# Patient Record
Sex: Female | Born: 1960
Health system: Southern US, Community
[De-identification: ages and names within clinical notes are randomized; demographics above are authoritative.]

## PROBLEM LIST (undated history)

## (undated) DIAGNOSIS — I341 Nonrheumatic mitral (valve) prolapse: Secondary | ICD-10-CM

## (undated) DIAGNOSIS — J302 Other seasonal allergic rhinitis: Secondary | ICD-10-CM

## (undated) HISTORY — PX: OTHER SURGICAL HISTORY: SHX169

## (undated) HISTORY — PX: WISDOM TOOTH EXTRACTION: SHX21

---

## 2006-07-28 ENCOUNTER — Other Ambulatory Visit: Admission: RE | Admit: 2006-07-28 | Discharge: 2006-07-28 | Payer: Self-pay | Admitting: Family Medicine

## 2011-04-28 ENCOUNTER — Encounter (HOSPITAL_COMMUNITY): Payer: Self-pay

## 2011-05-12 ENCOUNTER — Encounter (HOSPITAL_COMMUNITY): Payer: Self-pay

## 2011-05-12 ENCOUNTER — Encounter (HOSPITAL_COMMUNITY)
Admission: RE | Admit: 2011-05-12 | Discharge: 2011-05-12 | Disposition: A | Discharge status: Home / Self Care | Payer: 59 | Source: Ambulatory Visit | Attending: Obstetrics and Gynecology | Admitting: Obstetrics and Gynecology

## 2011-05-12 HISTORY — DX: Nonrheumatic mitral (valve) prolapse: I34.1

## 2011-05-12 LAB — CBC
MCHC: 34.3 g/dL (ref 30.0–36.0)
RDW: 12.5 % (ref 11.5–15.5)

## 2011-05-12 NOTE — Patient Instructions (Addendum)
20 [name]  05/12/2011   Your procedure is scheduled on:  05/19/11 9:00  Enter through the Main Entrance of [facility] at 730 AM.  Pick up the phone at the desk and dial [phone].   Call this number if you have problems the morning of surgery: [phone]   Remember:   Do not eat food:After Midnight.  Do not drink clear liquids: After Midnight.  Take these medicines the morning of surgery with A SIP OF WATER: NA   Do not wear jewelry, make-up or nail polish.  Do not wear lotions, powders, or perfumes. You may wear deodorant.  Do not shave 48 hours prior to surgery.  Do not bring valuables to the hospital.  Contacts, dentures or bridgework may not be worn into surgery.  Leave suitcase in the car. After surgery it may be brought to your room.  For patients admitted to the hospital, checkout time is 11:00 AM the day of discharge.   Patients discharged the day of surgery will not be allowed to drive home.  Name and phone number of your driver: William-husband  Special Instructions: CHG Shower Use Special Wash: 1/2 bottle night before surgery and 1/2 bottle morning of surgery.   Please read over the following fact sheets that you were given: Surgical Site Infection Prevention

## 2011-05-18 NOTE — H&P (Signed)
NAMEESTELA, [name]              ACCOUNT NO.:  [id number]  MEDICAL RECORD NO.:  [id number]  LOCATION:  [facility]                         FACILITY:  [facility]  PHYSICIAN:  Lenoard Aden, M.D.DATE OF BIRTH:  1961-02-23  DATE OF ADMISSION:  04/08/2011 DATE OF DISCHARGE:                             HISTORY & PHYSICAL   CHIEF COMPLAINT:  Dysfunctional uterine bleeding.  HISTORY OF PRESENT ILLNESS:  The patient is a 51 year old white female, G3, P3, with a history of postmenopausal bleeding.  PAST MEDICAL HISTORY:  Remarkable.  MEDICATIONS:  hormone replacement therapy and calcium.  ALLERGIES:  None.  FAMILY HISTORY:  Skin cancer, diabetes, MI, and hypertension.  PERSONAL HISTORY:  Three previous vaginal deliveries.  She is a nonsmoker, nondrinker.  She denies domestic or physical violence.  She does have a history of a mitral valve disorder and heart disease requiring previous heart catheterization.  PHYSICAL EXAMINATION:  GENERAL:  She is a well-developed, well-nourished white female. VITAL SIGNS:  Height of 66 inches, weight of 145 pounds. HEENT:  Normal. NECK:  Supple.  Full range of motion. LUNGS:  Clear. HEART:  Regular rhythm. ABDOMEN:  Soft and nontender. PELVIC:  An anteflexed uterus and no adnexal masses. EXTREMITIES:  No cords. NEUROLOGIC:  Nonfocal. SKIN:  Intact.  IMPRESSION:  Postmenopausal bleeding with questionable structural lesion on sonohysterogram.  PLAN:  Diagnostic hysteroscopy, D and C, VersaPoint resection.  Risks of anesthesia, infection, bleeding, injury to abdominal organs, need for repair was discussed, delayed versus immediate complications to include bowel, bladder injury noted.  The patient acknowledges and wishes to proceed.     Lenoard Aden, M.D.     RJT/MEDQ  D:  05/18/2011  T:  05/18/2011  Job:  [id number]

## 2011-05-19 ENCOUNTER — Ambulatory Visit (HOSPITAL_COMMUNITY): Payer: 59 | Admitting: Anesthesiology

## 2011-05-19 ENCOUNTER — Other Ambulatory Visit: Payer: Self-pay | Admitting: Obstetrics and Gynecology

## 2011-05-19 ENCOUNTER — Encounter (HOSPITAL_COMMUNITY): Payer: Self-pay | Admitting: Anesthesiology

## 2011-05-19 ENCOUNTER — Ambulatory Visit (HOSPITAL_COMMUNITY)
Admission: RE | Admit: 2011-05-19 | Discharge: 2011-05-19 | Disposition: A | Discharge status: Home / Self Care | Payer: 59 | Source: Ambulatory Visit | Attending: Obstetrics and Gynecology | Admitting: Obstetrics and Gynecology

## 2011-05-19 ENCOUNTER — Encounter (HOSPITAL_COMMUNITY)
Admission: RE | Disposition: A | Discharge status: Home / Self Care | Payer: Self-pay | Source: Ambulatory Visit | Attending: Obstetrics and Gynecology

## 2011-05-19 ENCOUNTER — Encounter (HOSPITAL_COMMUNITY): Payer: Self-pay | Admitting: *Deleted

## 2011-05-19 DIAGNOSIS — N840 Polyp of corpus uteri: Secondary | ICD-10-CM | POA: Insufficient documentation

## 2011-05-19 DIAGNOSIS — N950 Postmenopausal bleeding: Secondary | ICD-10-CM | POA: Insufficient documentation

## 2011-05-19 DIAGNOSIS — N938 Other specified abnormal uterine and vaginal bleeding: Secondary | ICD-10-CM

## 2011-05-19 HISTORY — DX: Other seasonal allergic rhinitis: J30.2

## 2011-05-19 SURGERY — DILATATION & CURETTAGE/HYSTEROSCOPY WITH VERSAPOINT RESECTION
Anesthesia: General

## 2011-05-19 MED ORDER — FENTANYL CITRATE 0.05 MG/ML IJ SOLN: 25.0000 ug | INTRAMUSCULAR | Status: DC | PRN

## 2011-05-19 MED ORDER — FENTANYL CITRATE 0.05 MG/ML IJ SOLN
INTRAMUSCULAR | Status: DC | PRN
  Administered 2011-05-19: 100 ug via INTRAVENOUS

## 2011-05-19 MED ORDER — ONDANSETRON HCL 4 MG/2ML IJ SOLN
INTRAMUSCULAR | Status: DC | PRN
  Administered 2011-05-19: 4 mg via INTRAVENOUS

## 2011-05-19 MED ORDER — LIDOCAINE HCL (CARDIAC) 20 MG/ML IV SOLN
INTRAVENOUS | Status: DC | PRN
  Administered 2011-05-19: 80 mg via INTRAVENOUS

## 2011-05-19 MED ORDER — VASOPRESSIN 20 UNIT/ML IJ SOLN
INTRAVENOUS | Status: DC | PRN
  Administered 2011-05-19: 09:00:00 via INTRAMUSCULAR

## 2011-05-19 MED ORDER — KETOROLAC TROMETHAMINE 60 MG/2ML IM SOLN
INTRAMUSCULAR | Status: DC | PRN
  Administered 2011-05-19: 30 mg via INTRAMUSCULAR

## 2011-05-19 MED ORDER — LACTATED RINGERS IV SOLN
INTRAVENOUS | Status: DC
  Administered 2011-05-19: 125 mL/h via INTRAVENOUS
  Administered 2011-05-19: 10:00:00 via INTRAVENOUS

## 2011-05-19 MED ORDER — TRAMADOL HCL 50 MG PO TABS: 50.0000 mg | ORAL_TABLET | Freq: Four times a day (QID) | ORAL | Status: AC | PRN

## 2011-05-19 MED ORDER — DEXAMETHASONE SODIUM PHOSPHATE 4 MG/ML IJ SOLN
INTRAMUSCULAR | Status: DC | PRN
  Administered 2011-05-19: 10 mg via INTRAVENOUS

## 2011-05-19 MED ORDER — GLYCOPYRROLATE 0.2 MG/ML IJ SOLN
INTRAMUSCULAR | Status: DC | PRN
  Administered 2011-05-19: 0.1 mg via INTRAVENOUS

## 2011-05-19 MED ORDER — BUPIVACAINE HCL (PF) 0.25 % IJ SOLN
INTRAMUSCULAR | Status: DC | PRN
  Administered 2011-05-19: 20 mL

## 2011-05-19 MED ORDER — MIDAZOLAM HCL 5 MG/5ML IJ SOLN
INTRAMUSCULAR | Status: DC | PRN
  Administered 2011-05-19: 2 mg via INTRAVENOUS

## 2011-05-19 MED ORDER — PROPOFOL 10 MG/ML IV EMUL
INTRAVENOUS | Status: DC | PRN
  Administered 2011-05-19: 160 mg via INTRAVENOUS

## 2011-05-19 MED ORDER — KETOROLAC TROMETHAMINE 30 MG/ML IJ SOLN
INTRAMUSCULAR | Status: DC | PRN
  Administered 2011-05-19: 30 mg via INTRAVENOUS

## 2011-05-19 SURGICAL SUPPLY — 12 items
CANISTER SUCTION 2500CC (MISCELLANEOUS) ×2 IMPLANT
CATH ROBINSON RED A/P 16FR (CATHETERS) ×2 IMPLANT
CLOTH BEACON ORANGE TIMEOUT ST (SAFETY) ×2 IMPLANT
CONTAINER PREFILL 10% NBF 60ML (FORM) ×4 IMPLANT
ELECTRODE RT ANGLE VERSAPOINT (CUTTING LOOP) ×2 IMPLANT
GLOVE BIO SURGEON STRL SZ7.5 (GLOVE) ×4 IMPLANT
GOWN PREVENTION PLUS LG XLONG (DISPOSABLE) ×2 IMPLANT
GOWN STRL REIN XL XLG (GOWN DISPOSABLE) ×2 IMPLANT
PACK HYSTEROSCOPY LF (CUSTOM PROCEDURE TRAY) ×2 IMPLANT
SYR TB 1ML 25GX5/8 (SYRINGE) ×2 IMPLANT
TOWEL OR 17X24 6PK STRL BLUE (TOWEL DISPOSABLE) ×4 IMPLANT
WATER STERILE IRR 1000ML POUR (IV SOLUTION) ×2 IMPLANT

## 2011-05-19 NOTE — Op Note (Signed)
05/19/2011  9:39 AM  PATIENT:  [name]  51 y.o. female  PRE-OPERATIVE DIAGNOSIS:  Dysfunctional Uterine Bleeding  POST-OPERATIVE DIAGNOSIS:  Dysfunctional Uterine Bleeding Endometrial polyps  PROCEDURE:  Procedure(s): DILATATION & CURETTAGE/HYSTEROSCOPY WITH VERSAPOINT RESECTION of Endometrial polyps  SURGEON:  Surgeon(s): Lenoard Aden, MD  ASSISTANTS: none   ANESTHESIA:   local and general  ESTIMATED BLOOD LOSS: * No blood loss amount entered *   DRAINS: none   LOCAL MEDICATIONS USED:  MARCAINE 20CC  SPECIMEN:  Source of Specimen:  Endometrial polyp fragments and EMC  DISPOSITION OF SPECIMEN:  PATHOLOGY  COUNTS:  YES  DICTATION #: [id number]  PLAN OF CARE: DC home  PATIENT DISPOSITION:  PACU - hemodynamically stable.  Fluid deficit less than 50 ml.

## 2011-05-19 NOTE — Progress Notes (Signed)
Patient ID: [name], female   DOB: 12/24/1960, 51 y.o.   MRN: [id number] Consent done. Pt examined. No changes noted.

## 2011-05-19 NOTE — Transfer of Care (Signed)
Immediate Anesthesia Transfer of Care Note  Patient: [name]  Procedure(s) Performed:  DILATATION & CURETTAGE/HYSTEROSCOPY WITH VERSAPOINT RESECTION  Patient Location: PACU  Anesthesia Type: General  Level of Consciousness: awake, alert  and oriented  Airway & Oxygen Therapy: Patient Spontanous Breathing and Patient connected to nasal cannula oxygen  Post-op Assessment: Report given to PACU RN and Post -op Vital signs reviewed and stable  Post vital signs: Reviewed and stable  Complications: No apparent anesthesia complications

## 2011-05-19 NOTE — Anesthesia Procedure Notes (Signed)
Procedure Name: LMA Insertion Date/Time: 05/19/2011 9:20 AM Performed by: Karleen Dolphin Pre-anesthesia Checklist: Patient identified, Patient being monitored, Emergency Drugs available, Timeout performed and Suction available Patient Re-evaluated:Patient Re-evaluated prior to inductionOxygen Delivery Method: Circle System Utilized Preoxygenation: Pre-oxygenation with 100% oxygen Intubation Type: IV induction Ventilation: Mask ventilation without difficulty LMA: LMA inserted LMA Size: 4.0 Number of attempts: 1 Tube secured with: Tape

## 2011-05-19 NOTE — Anesthesia Preprocedure Evaluation (Signed)
Anesthesia Evaluation  Patient identified by MRN, date of birth, ID band Patient awake    Reviewed: Allergy & Precautions, H&P , NPO status , Patient's Chart, lab work & pertinent test results  Airway Mallampati: II TM Distance: >3 FB Neck ROM: full    Dental No notable dental hx. (+) Caps   Pulmonary neg pulmonary ROS,  clear to auscultation  Pulmonary exam normal       Cardiovascular neg cardio ROS regular Normal    Neuro/Psych Negative Neurological ROS  Negative Psych ROS   GI/Hepatic negative GI ROS, Neg liver ROS,   Endo/Other  Negative Endocrine ROS  Renal/GU negative Renal ROS  Genitourinary negative   Musculoskeletal   Abdominal   Peds  Hematology negative hematology ROS (+)   Anesthesia Other Findings   Reproductive/Obstetrics negative OB ROS                           Anesthesia Physical Anesthesia Plan  ASA: I  Anesthesia Plan: General LMA   Post-op Pain Management:    Induction:   Airway Management Planned:   Additional Equipment:   Intra-op Plan:   Post-operative Plan:   Informed Consent: I have reviewed the patients History and Physical, chart, labs and discussed the procedure including the risks, benefits and alternatives for the proposed anesthesia with the patient or authorized representative who has indicated his/her understanding and acceptance.     Plan Discussed with: Anesthesiologist, CRNA and Surgeon  Anesthesia Plan Comments:         Anesthesia Quick Evaluation

## 2011-05-19 NOTE — Anesthesia Postprocedure Evaluation (Signed)
  Anesthesia Post-op Note  Patient: [name]  Procedure(s) Performed:  DILATATION & CURETTAGE/HYSTEROSCOPY WITH VERSAPOINT RESECTION  Patient Location: PACU  Anesthesia Type: General  Level of Consciousness: awake, alert  and oriented  Airway and Oxygen Therapy: Patient Spontanous Breathing  Post-op Pain: none  Post-op Assessment: Post-op Vital signs reviewed, Patient's Cardiovascular Status Stable, Respiratory Function Stable, Patent Airway, No signs of Nausea or vomiting and Pain level controlled  Post-op Vital Signs: Reviewed and stable  Complications: No apparent anesthesia complications

## 2011-05-19 NOTE — Op Note (Signed)
NAME:  [name], [name]              ACCOUNT NO.:  [id number]  MEDICAL RECORD NO.:  [id number]  LOCATION:  [facility]                          FACILITY:  [facility]  PHYSICIAN:  Lenoard Aden, M.D.DATE OF BIRTH:  12-Aug-1960  DATE OF PROCEDURE:  05/19/2011 DATE OF DISCHARGE:                              OPERATIVE REPORT   DESCRIPTION OF PROCEDURE:  After being apprised of risks of anesthesia, infection, bleeding, injury to abdominal organs, possible need for repair, delayed versus immediate complications to include bowel and bladder injury, possible need for repair, the patient was brought to the operating room and was administered a general anesthetic without complications.  She was prepped and draped in the usual sterile fashion. Feet were placed in Yellofin stirrups in dorsal lithotomy position. Exam under anesthesia revealed a mid position to anteflexed uterus and no adnexal masses noted.  Speculum was placed.  Single-tooth tenaculum was placed on the anterior lip of the cervix.  The dilute Pitressin solution 20 and 50 was placed, 16 mL total at 3 and 9 o'clock at the cervical vaginal junction, then followed by a dilute Marcaine solution for a standard paracervical block, 20 mL total.  Cervix was then easily dilated up to a #27 Pratt dilator.  Hysteroscope was placed. Visualization revealed 2 posterior wall endometrial polyps which were resected in multiple passes using the VersaPoint resectoscope with the right-angled loop.  Good hemostasis was noted.  Endometrial cavity was otherwise clear.  Endometrial curettings were collected in a 4-quadrant method using sharp curettage.  Good hemostasis was noted.  Fluid deficit was less than 50 mL.  The procedure was terminated.  All instruments were removed.  Hemostasis was assured.  The patient tolerated the procedure well and was awakened and transferred to recovery in good condition.     Lenoard Aden, M.D.     RJT/MEDQ  D:   05/19/2011  T:  05/19/2011  Job:  [id number]

## 2013-08-24 ENCOUNTER — Other Ambulatory Visit: Payer: Self-pay | Admitting: Radiology

## 2017-03-04 DIAGNOSIS — Z23 Encounter for immunization: Secondary | ICD-10-CM | POA: Diagnosis not present

## 2017-06-15 ENCOUNTER — Telehealth: Payer: Self-pay | Admitting: Cardiology

## 2017-06-15 DIAGNOSIS — Z23 Encounter for immunization: Secondary | ICD-10-CM | POA: Diagnosis not present

## 2017-06-15 DIAGNOSIS — M79601 Pain in right arm: Secondary | ICD-10-CM | POA: Diagnosis not present

## 2017-06-15 DIAGNOSIS — M79602 Pain in left arm: Secondary | ICD-10-CM | POA: Diagnosis not present

## 2017-06-15 NOTE — Telephone Encounter (Signed)
I did not need this encounter. °

## 2017-06-16 ENCOUNTER — Ambulatory Visit: Payer: 59 | Admitting: Cardiovascular Disease

## 2017-06-16 ENCOUNTER — Encounter (INDEPENDENT_AMBULATORY_CARE_PROVIDER_SITE_OTHER): Payer: Self-pay

## 2017-06-16 ENCOUNTER — Encounter: Payer: Self-pay | Admitting: Cardiovascular Disease

## 2017-06-16 VITALS — BP 154/94 | HR 82 | Ht 66.0 in | Wt 134.6 lb

## 2017-06-16 DIAGNOSIS — E782 Mixed hyperlipidemia: Secondary | ICD-10-CM | POA: Diagnosis not present

## 2017-06-16 DIAGNOSIS — R0789 Other chest pain: Secondary | ICD-10-CM

## 2017-06-16 DIAGNOSIS — I10 Essential (primary) hypertension: Secondary | ICD-10-CM

## 2017-06-16 DIAGNOSIS — R9431 Abnormal electrocardiogram [ECG] [EKG]: Secondary | ICD-10-CM

## 2017-06-16 MED ORDER — ASPIRIN EC 81 MG PO TBEC: 81.0000 mg | DELAYED_RELEASE_TABLET | Freq: Every day | ORAL | Status: DC

## 2017-06-16 MED ORDER — METOPROLOL TARTRATE 50 MG PO TABS: 50.0000 mg | ORAL_TABLET | Freq: Once | ORAL | 0 refills | Status: DC

## 2017-06-16 NOTE — Patient Instructions (Signed)
Medication Instructions:  Your physician has recommended you make the following change in your medication:  START Aspirin 81 mg once daily   Labwork: TODAY - basic metabolic panel, liver panel, cholesterol   Testing/Procedures: [facility] of [facility] at xx:xx AM (30-45 minutes prior to test start time)  [facility] [address] [place], [place] [address] [phone]  Proceed to the [facility] Radiology Department (First Floor).  Please follow these instructions carefully (unless otherwise directed):  Hold all erectile dysfunction medications at least 48 hours prior to test.  On the Night Before the Test: . Drink plenty of water. . Do not consume any caffeinated/decaffeinated beverages or chocolate 12 hours prior to your test. . Do not take any antihistamines 12 hours prior to your test. . If you take Metformin do not take 24 hours prior to test. . If the patient has contrast allergy: ? Patient will need a prescription for Prednisone and very clear instructions (as follows): 1. Prednisone 50 mg - take 13 hours prior to test 2. Take another Prednisone 50 mg 7 hours prior to test 3. Take another Prednisone 50 mg 1 hour prior to test 4. Take Benadryl 50 mg 1 hour prior to test . Patient must complete all four doses of above prophylactic medications. . Patient will need a ride after test due to Benadryl.  On the Day of the Test: . Drink plenty of water. Do not drink any water within one hour of the test. . Do not eat any food 4 hours prior to the test. . You may take your regular medications prior to the test. . IF NOT ON A BETA BLOCKER - Take 50 mg of lopressor (metoprolol) one hour before the test. . HOLD Furosemide morning of the test.  After the Test: . Drink plenty of water. . After receiving IV contrast, you may experience a mild flushed feeling. This is normal. . On occasion, you may experience a mild  rash up to 24 hours after the test. This is not dangerous. If this occurs, you can take Benadryl 25 mg and increase your fluid intake. . If you experience trouble breathing, this can be serious. If it is severe call 911 IMMEDIATELY. If it is mild, please call our office. . If you take any of these medications: Glipizide/Metformin, Avandament, Glucavance, please do not take 48 hours after completing test.  Your physician has requested that you have an echocardiogram. Echocardiography is a painless test that uses sound waves to create images of your heart. It provides your doctor with information about the size and shape of your heart and how well your heart's chambers and valves are working. This procedure takes approximately one hour. There are no restrictions for this procedure.    Follow-Up: Your physician recommends that you schedule a follow-up appointment in: 6 weeks with Dr. Elease HashimotoNahser   If you need a refill on your cardiac medications before your next appointment, please call your pharmacy.   Thank you for choosing [facility]! Eligha BridegroomMichelle Swinyer, RN [phone]  '

## 2017-06-16 NOTE — Progress Notes (Signed)
Cardiology Office Note:    Date:  06/16/2017   ID:  [name], DOB January 18, 1961, MRN [id number]  PCP:  Natalie Hazel, MD  Cardiologist:  No primary care provider on file.   Referring MD: No ref. provider found   Problem List 1. Mitral Valve prolapse 2. Chest pain  3.  Hyperlipidemia   Chief Complaint  Patient presents with  . Chest Pain    History of Present Illness:    [name] is a 57 y.o. female with a hx of chest pain .  Seen with husband , Will Pt has various issues. 2 weeks ago Had bilateral jaw pain and intrascapular pain after af vigorous workout. Did not feel right all day  Since that time , has had off and on pain ,  Dull pain in left arm at rest Has had intermittant dull chest pain .   Last for 1 sec.   Has had sensation of being cold but was sweating  No aches or chilll,  No fever  BP has been elevated recently    Was seen by Dr. Allyson Flores 20 years ago  Echo and GXT looked ok ( was told her heart was a little enlarged )  From Sept. 2017 Chol = 251 Trigs = 100 HDL = 71  LDL = 161  Exercises regularly  Does not smoke    Past Medical History:  Diagnosis Date  . Mitral valve prolapse    Seen by [facility]. this year, no test/no F/U required  . Seasonal allergies     Past Surgical History:  Procedure Laterality Date  . SVD     x 3  . WISDOM TOOTH EXTRACTION      Current Medications: Current Meds  Medication Sig  . Calcium Carb-Cholecalciferol (CALCIUM-VITAMIN D) 500-200 MG-UNIT tablet Take 1 tablet by mouth daily.  Marland Kitchen estradiol (VIVELLE-DOT) 0.05 MG/24HR Place 1 patch onto the skin once a week. Sunday/Wednesday    . Krill Oil 300 MG CAPS Take 1 capsule by mouth daily.    . progesterone (PROMETRIUM) 100 MG capsule Take 100 mg by mouth daily.       Allergies:   Patient has no known allergies.   Social History   Socioeconomic History  . Marital status: Married    Spouse name: None  . Number of children: None  . Years of  education: None  . Highest education level: None  Social Needs  . Financial resource strain: None  . Food insecurity - worry: None  . Food insecurity - inability: None  . Transportation needs - medical: None  . Transportation needs - non-medical: None  Occupational History  . None  Tobacco Use  . Smoking status: Never Smoker  . Smokeless tobacco: Never Used  Substance and Sexual Activity  . Alcohol use: Yes    Alcohol/week: 1.8 oz    Types: 3 Glasses of wine per week    Comment: Max per month  . Drug use: No  . Sexual activity: Yes    Birth control/protection: Post-menopausal  Other Topics Concern  . None  Social History Narrative  . None     Family History: The patient's family history includes CAD in her father.  ROS:   Please see the history of present illness.     All other systems reviewed and are negative.  EKGs/Labs/Other Studies Reviewed:    The following studies were reviewed today:   EKG:  EKG is  ordered today.  The ekg ordered today demonstrates  NSR at 81.   NS ST abn.   In lateral leads   Recent Labs: No results found for requested labs within last 8760 hours.  Recent Lipid Panel No results found for: CHOL, TRIG, HDL, CHOLHDL, VLDL, LDLCALC, LDLDIRECT  Physical Exam:    VS:  BP (!) 154/94   Pulse 82   Ht 5\' 6"  (1.676 m)   Wt 134 lb 9.6 oz (61.1 kg)   SpO2 97%   BMI 21.73 kg/m     Wt Readings from Last 3 Encounters:  06/16/17 134 lb 9.6 oz (61.1 kg)  05/12/11 145 lb (65.8 kg)     GEN:  Well nourished, well developed in no acute distress HEENT: Normal NECK: No JVD; No carotid bruits LYMPHATICS: No lymphadenopathy CARDIAC: RRR,  Soft systolic murmur , rubs, gallops RESPIRATORY:  Clear to auscultation without rales, wheezing or rhonchi  ABDOMEN: Soft, non-tender, non-distended MUSCULOSKELETAL:  No edema; No deformity  SKIN: Warm and dry NEUROLOGIC:  Alert and oriented x 3 PSYCHIATRIC:  Normal affect   ASSESSMENT:    No diagnosis  found. PLAN:    In order of problems listed above:  1. Chest discomfort: Natalie MossesDiana presents today with chest pain/interscapular pain.  This is also associated with bilateral arm weakness and numbness.  These episodes occur spontaneously.  She has had some shortness of breath with exertion.  She has a family history of coronary artery disease.  She has nonspecific ST changes in the lateral leads today by EKG.  She has hyperlipidemia but is never been treated.  I am concerned that she may have coronary artery disease.  We will start her on an aspirin 81 mg a day.  Her husband has some nitroglycerin that she may take as needed.  We will schedule her for a coronary CT angiogram for further evaluation of her coronary arteries.  Check fasting lipids today.  2.  Hyperlipidemia: Fasting labs were performed in September, 2017: We will repeat her labs today.  3.  Hypertension: Her blood pressure has been elevated for the past several office visits.  She does not pay attention to diet.  Of asked her to work on reducing the salt in her diet.   Medication Adjustments/Labs and Tests Ordered: Current medicines are reviewed at length with the patient today.  Concerns regarding medicines are outlined above.  No orders of the defined types were placed in this encounter.  No orders of the defined types were placed in this encounter.   Signed, Kristeen MissPhilip Nahser, MD  06/16/2017 2:18 PM    [facility]

## 2017-06-17 LAB — BASIC METABOLIC PANEL
BUN / CREAT RATIO: 22 (ref 9–23)
BUN: 15 mg/dL (ref 6–24)
CHLORIDE: 101 mmol/L (ref 96–106)
CO2: 24 mmol/L (ref 20–29)
CREATININE: 0.69 mg/dL (ref 0.57–1.00)
Calcium: 10.1 mg/dL (ref 8.7–10.2)
GFR calc Af Amer: 111 mL/min/{1.73_m2} (ref >59)
GFR calc non Af Amer: 96 mL/min/{1.73_m2} (ref >59)
GLUCOSE: 106 mg/dL — AB (ref 65–99)
POTASSIUM: 4.8 mmol/L (ref 3.5–5.2)
SODIUM: 143 mmol/L (ref 134–144)

## 2017-06-17 LAB — HEPATIC FUNCTION PANEL
ALBUMIN: 5.1 g/dL (ref 3.5–5.5)
ALT: 33 IU/L — ABNORMAL HIGH (ref 0–32)
AST: 30 IU/L (ref 0–40)
Alkaline Phosphatase: 63 IU/L (ref 39–117)
Bilirubin Total: 0.6 mg/dL (ref 0.0–1.2)
Bilirubin, Direct: 0.13 mg/dL (ref 0.00–0.40)
Total Protein: 7.5 g/dL (ref 6.0–8.5)

## 2017-06-17 LAB — LIPID PANEL
CHOLESTEROL TOTAL: 274 mg/dL — AB (ref 100–199)
Chol/HDL Ratio: 3.6 ratio (ref 0.0–4.4)
HDL: 77 mg/dL (ref >39)
LDL Calculated: 175 mg/dL — ABNORMAL HIGH (ref 0–99)
TRIGLYCERIDES: 109 mg/dL (ref 0–149)
VLDL CHOLESTEROL CAL: 22 mg/dL (ref 5–40)

## 2017-06-24 ENCOUNTER — Telehealth: Payer: Self-pay | Admitting: Cardiovascular Disease

## 2017-06-24 ENCOUNTER — Other Ambulatory Visit: Payer: Self-pay

## 2017-06-24 ENCOUNTER — Ambulatory Visit (HOSPITAL_COMMUNITY): Payer: 59 | Attending: Cardiovascular Disease

## 2017-06-24 DIAGNOSIS — I081 Rheumatic disorders of both mitral and tricuspid valves: Secondary | ICD-10-CM | POA: Diagnosis not present

## 2017-06-24 DIAGNOSIS — I119 Hypertensive heart disease without heart failure: Secondary | ICD-10-CM | POA: Diagnosis not present

## 2017-06-24 DIAGNOSIS — E782 Mixed hyperlipidemia: Secondary | ICD-10-CM | POA: Diagnosis not present

## 2017-06-24 DIAGNOSIS — R0789 Other chest pain: Secondary | ICD-10-CM | POA: Diagnosis not present

## 2017-06-24 DIAGNOSIS — R9431 Abnormal electrocardiogram [ECG] [EKG]: Secondary | ICD-10-CM | POA: Diagnosis not present

## 2017-06-24 DIAGNOSIS — I10 Essential (primary) hypertension: Secondary | ICD-10-CM | POA: Diagnosis not present

## 2017-06-24 DIAGNOSIS — Z8249 Family history of ischemic heart disease and other diseases of the circulatory system: Secondary | ICD-10-CM | POA: Insufficient documentation

## 2017-06-24 NOTE — Telephone Encounter (Signed)
Reviewed lab results and plan of care with patient. She states she would really like to work on reducing her cholesterol with her diet before starting a statin. She states she definitely has room for improvement and would like permission from Dr. Elease HashimotoNahser to work on diet before starting medication. She has a follow-up appointment in a few weeks and I advised that we will make a plan at that time to schedule follow-up lab work. She thanked me for the call.

## 2017-06-24 NOTE — Telephone Encounter (Signed)
Follow Up:     Returning your call from 06-17-17,concerning her lab results.

## 2017-07-02 ENCOUNTER — Telehealth: Payer: Self-pay | Admitting: Cardiovascular Disease

## 2017-07-02 NOTE — Telephone Encounter (Signed)
°*  STAT* If patient is at the pharmacy, call can be transferred to refill team.   1. Which medications need to be refilled? (please list name of each medication and dose if known) Lopressor 50 mg   2. Which pharmacy/location (including street and city if local pharmacy) is medication to be sent to?[facility] [address] - [place], [place] - [id number]  3. Do they need a 30 day or 90 day supply? 1 pill needed for procedure on 07-13-17

## 2017-07-02 NOTE — Telephone Encounter (Signed)
Rx for single dose was sent in at her office visit? Does she need another? Please advise. Thanks, MI

## 2017-07-02 NOTE — Telephone Encounter (Signed)
This is a one time medication prior to Coronary CT, so no refill needed. Thank you.

## 2017-07-02 NOTE — Telephone Encounter (Signed)
It is not documented whether patient called or pharmacy called. I attempted to reach patient but was unable and vm is unidentified.

## 2017-07-03 NOTE — Telephone Encounter (Signed)
Attempted to reach patient but did not get an answer. °

## 2017-07-03 NOTE — Telephone Encounter (Signed)
Attempted to reach patient again but her phone only rang once and then an unidentified vm came on. I called the pharmacy and was informed that the patient never picked up the one dose that was sent in earlier this month but they will get this ready for her.

## 2017-07-13 ENCOUNTER — Ambulatory Visit (HOSPITAL_COMMUNITY): Payer: 59

## 2017-07-29 ENCOUNTER — Ambulatory Visit: Payer: 59 | Admitting: Cardiovascular Disease

## 2017-08-05 ENCOUNTER — Ambulatory Visit (HOSPITAL_COMMUNITY): Admission: RE | Admit: 2017-08-05 | Payer: 59 | Source: Ambulatory Visit

## 2017-08-05 ENCOUNTER — Ambulatory Visit (HOSPITAL_COMMUNITY)
Admission: RE | Admit: 2017-08-05 | Discharge: 2017-08-05 | Disposition: A | Discharge status: Home / Self Care | Payer: 59 | Source: Ambulatory Visit | Attending: Cardiovascular Disease | Admitting: Cardiovascular Disease

## 2017-08-05 DIAGNOSIS — R9431 Abnormal electrocardiogram [ECG] [EKG]: Secondary | ICD-10-CM | POA: Diagnosis not present

## 2017-08-05 DIAGNOSIS — R079 Chest pain, unspecified: Secondary | ICD-10-CM | POA: Diagnosis not present

## 2017-08-05 DIAGNOSIS — E782 Mixed hyperlipidemia: Secondary | ICD-10-CM

## 2017-08-05 DIAGNOSIS — I10 Essential (primary) hypertension: Secondary | ICD-10-CM | POA: Diagnosis not present

## 2017-08-05 DIAGNOSIS — R0789 Other chest pain: Secondary | ICD-10-CM | POA: Diagnosis not present

## 2017-08-05 MED ORDER — IOPAMIDOL (ISOVUE-370) INJECTION 76%
INTRAVENOUS | Status: AC
  Administered 2017-08-05: 80 mL
  Filled 2017-08-05: qty 100

## 2017-08-05 MED ORDER — NITROGLYCERIN 0.4 MG SL SUBL
SUBLINGUAL_TABLET | SUBLINGUAL | Status: AC
  Administered 2017-08-05: 0.8 mg via SUBLINGUAL
  Filled 2017-08-05: qty 2

## 2017-08-05 MED ORDER — NITROGLYCERIN 0.4 MG SL SUBL
0.8000 mg | SUBLINGUAL_TABLET | Freq: Once | SUBLINGUAL | Status: AC
  Administered 2017-08-05: 0.8 mg via SUBLINGUAL

## 2017-08-05 NOTE — Progress Notes (Signed)
Pt complains of slight headache. Self-discharge ambulatory to main entrance.

## 2017-08-06 ENCOUNTER — Telehealth: Payer: Self-pay | Admitting: Nurse Practitioner

## 2017-08-06 DIAGNOSIS — I2510 Atherosclerotic heart disease of native coronary artery without angina pectoris: Secondary | ICD-10-CM

## 2017-08-06 DIAGNOSIS — E782 Mixed hyperlipidemia: Secondary | ICD-10-CM

## 2017-08-06 NOTE — Telephone Encounter (Signed)
-----   Message from Vesta MixerPhilip J Nahser, MD sent at 08/06/2017  7:05 AM EDT ----- Mild plaque in the mid LAD.  Coronary calcium score is 4 ( which is low but does indicate some degree of CAD )  No significant stenosis She has hyperlipidemia.   Lets start crestor 10 mg a day.   Check lipids , liver enz and BMP in 3 months

## 2017-08-06 NOTE — Telephone Encounter (Signed)
I think diet and exercise will help but will likely not be enough to get the LDL low enough - starting at 175  I would recommend diet, exercise, and low dose crestor  But if she does not want to start crestor, we will recheck lipids in 3 months after diet and exercise

## 2017-08-06 NOTE — Telephone Encounter (Signed)
Spoke with patient regarding Coronary CT results. I reviewed Dr. Harvie BridgeNahser's advice with her and she states she has been working hard on her diet and exercise since I called to report elevated LDL in February. I explained the correlation between these 2 test results and she verbalized understanding. She requests to continue working on diet and exercise and to get repeat lab work prior to follow up with Dr. Elease HashimotoNahser on 5/30. I advised that I will order lipid profile for more specific details on particle size and scheduled her lab appointment for 5/28. She is aware Dr. Elease HashimotoNahser will discuss results and treatment plan when she sees him on 5/30. She thanked me for my help.

## 2017-08-19 NOTE — Telephone Encounter (Signed)
Left detailed message on patient's voice mail with Dr. Harvie BridgeNahser's advice. I advised her to call back if she would like to go ahead and start low dose Crestor or if she has additional questions or concerns prior to follow-up appointment with Dr. Elease HashimotoNahser.

## 2017-09-24 DIAGNOSIS — Z01419 Encounter for gynecological examination (general) (routine) without abnormal findings: Secondary | ICD-10-CM | POA: Diagnosis not present

## 2017-09-24 DIAGNOSIS — Z1231 Encounter for screening mammogram for malignant neoplasm of breast: Secondary | ICD-10-CM | POA: Diagnosis not present

## 2017-09-24 DIAGNOSIS — Z6821 Body mass index (BMI) 21.0-21.9, adult: Secondary | ICD-10-CM | POA: Diagnosis not present

## 2017-10-06 ENCOUNTER — Other Ambulatory Visit: Payer: 59 | Admitting: *Deleted

## 2017-10-06 DIAGNOSIS — I2510 Atherosclerotic heart disease of native coronary artery without angina pectoris: Secondary | ICD-10-CM | POA: Diagnosis not present

## 2017-10-06 DIAGNOSIS — E782 Mixed hyperlipidemia: Secondary | ICD-10-CM

## 2017-10-06 DIAGNOSIS — I10 Essential (primary) hypertension: Secondary | ICD-10-CM

## 2017-10-06 DIAGNOSIS — R0789 Other chest pain: Secondary | ICD-10-CM

## 2017-10-06 DIAGNOSIS — R9431 Abnormal electrocardiogram [ECG] [EKG]: Secondary | ICD-10-CM

## 2017-10-07 LAB — NMR LIPOPROF + GRAPH
Cholesterol, Total: 239 mg/dL — ABNORMAL HIGH (ref 100–199)
HDL Particle Number: 44.6 umol/L (ref >=30.5)
HDL-C: 66 mg/dL (ref >39)
LDL PARTICLE NUMBER: 1732 nmol/L — AB (ref <1000)
LDL SIZE: 21.2 nm (ref >20.5)
LDL-C: 146 mg/dL — ABNORMAL HIGH (ref 0–99)
LP-IR Score: <25 (ref <=45)
SMALL LDL PARTICLE NUMBER: 702 nmol/L — AB (ref <=527)
Triglycerides: 136 mg/dL (ref 0–149)

## 2017-10-07 LAB — BASIC METABOLIC PANEL
BUN/Creatinine Ratio: 17 (ref 9–23)
BUN: 14 mg/dL (ref 6–24)
CO2: 23 mmol/L (ref 20–29)
Calcium: 9.3 mg/dL (ref 8.7–10.2)
Chloride: 101 mmol/L (ref 96–106)
Creatinine, Ser: 0.81 mg/dL (ref 0.57–1.00)
GFR calc Af Amer: 93 mL/min/{1.73_m2} (ref >59)
GFR calc non Af Amer: 80 mL/min/{1.73_m2} (ref >59)
GLUCOSE: 89 mg/dL (ref 65–99)
POTASSIUM: 4.3 mmol/L (ref 3.5–5.2)
SODIUM: 138 mmol/L (ref 134–144)

## 2017-10-07 LAB — LIPOPROTEIN A (LPA): LIPOPROTEIN (A): 43 nmol/L (ref <75)

## 2017-10-08 ENCOUNTER — Ambulatory Visit: Payer: 59 | Admitting: Cardiovascular Disease

## 2017-10-08 ENCOUNTER — Encounter: Payer: Self-pay | Admitting: Cardiovascular Disease

## 2017-10-08 VITALS — BP 140/84 | HR 75 | Ht 65.0 in | Wt 131.0 lb

## 2017-10-08 DIAGNOSIS — E782 Mixed hyperlipidemia: Secondary | ICD-10-CM

## 2017-10-08 MED ORDER — ROSUVASTATIN CALCIUM 10 MG PO TABS: 10.0000 mg | ORAL_TABLET | Freq: Every day | ORAL | 11 refills | Status: DC

## 2017-10-08 NOTE — Progress Notes (Signed)
Cardiology Office Note:    Date:  10/08/2017   ID:  [name], DOB 01-Mar-1961, MRN [id number]  PCP:  Sigmund Hazel, MD  Cardiologist:  Kristeen Miss, MD   Referring MD: Sigmund Hazel, MD   Problem List 1. Mitral Valve prolapse 2. Chest pain  3.  Hyperlipidemia   Chief Complaint  Patient presents with  . Hyperlipidemia    History of Present Illness:    [name] is a 57 y.o. female with a hx of chest pain .  Seen with husband , Will Pt has various issues. 2 weeks ago Had bilateral jaw pain and intrascapular pain after af vigorous workout. Did not feel right all day  Since that time , has had off and on pain ,  Dull pain in left arm at rest Has had intermittant dull chest pain .   Last for 1 sec.   Has had sensation of being cold but was sweating  No aches or chilll,  No fever  BP has been elevated recently    Was seen by Dr. Allyson Sabal 20 years ago  Echo and GXT looked ok ( was told her heart was a little enlarged )  From Sept. 2017 Chol = 251 Trigs = 100 HDL = 71  LDL = 161  Exercises regularly  Does not smoke   Oct 08, 2017:   [name] is seen back today for further evaluation of her chest discomfort. She had a coronary CT angiogram which revealed a coronary calcium score of 4.  She had no significant coronary artery disease. Recent lipid levels reveal a total cholesterol 239.  The LDL particle number is 1732.  The lipoprotein a is normal at 43.  No further episodes of CP   . Exercising well.     Past Medical History:  Diagnosis Date  . Mitral valve prolapse    Seen by [facility]. this year, no test/no F/U required  . Seasonal allergies     Past Surgical History:  Procedure Laterality Date  . SVD     x 3  . WISDOM TOOTH EXTRACTION      Current Medications: Current Meds  Medication Sig  . Calcium Carb-Cholecalciferol (CALCIUM-VITAMIN D) 500-200 MG-UNIT tablet Take 1 tablet by mouth daily.  Marland Kitchen estradiol (VIVELLE-DOT) 0.05 MG/24HR Place 1  patch onto the skin once a week. Sunday/Wednesday    . Krill Oil 300 MG CAPS Take 1 capsule by mouth daily.    . progesterone (PROMETRIUM) 100 MG capsule Take 100 mg by mouth daily.       Allergies:   Patient has no known allergies.   Social History   Socioeconomic History  . Marital status: Married    Spouse name: Not on file  . Number of children: Not on file  . Years of education: Not on file  . Highest education level: Not on file  Occupational History  . Not on file  Social Needs  . Financial resource strain: Not on file  . Food insecurity:    Worry: Not on file    Inability: Not on file  . Transportation needs:    Medical: Not on file    Non-medical: Not on file  Tobacco Use  . Smoking status: Never Smoker  . Smokeless tobacco: Never Used  Substance and Sexual Activity  . Alcohol use: Yes    Alcohol/week: 1.8 oz    Types: 3 Glasses of wine per week    Comment: Max per month  . Drug use:  No  . Sexual activity: Yes    Birth control/protection: Post-menopausal  Lifestyle  . Physical activity:    Days per week: Not on file    Minutes per session: Not on file  . Stress: Not on file  Relationships  . Social connections:    Talks on phone: Not on file    Gets together: Not on file    Attends religious service: Not on file    Active member of club or organization: Not on file    Attends meetings of clubs or organizations: Not on file    Relationship status: Not on file  Other Topics Concern  . Not on file  Social History Narrative  . Not on file     Family History: The patient's family history includes CAD in her father.  ROS:   Please see the history of present illness.     All other systems reviewed and are negative.  EKGs/Labs/Other Studies Reviewed:    The following studies were reviewed today:   EKG:  EKG is  ordered today.  The ekg ordered today demonstrates  NSR at 81.   NS ST abn.   In lateral leads   Recent Labs: 06/16/2017: ALT  33 10/06/2017: BUN 14; Creatinine, Ser 0.81; Potassium 4.3; Sodium 138  Recent Lipid Panel    Component Value Date/Time   CHOL 274 (H) 06/16/2017 1444   TRIG 109 06/16/2017 1444   HDL 77 06/16/2017 1444   CHOLHDL 3.6 06/16/2017 1444   LDLCALC 175 (H) 06/16/2017 1444   Physical Exam: Blood pressure 140/84, pulse 75, height  (1.651 m), weight 131 lb (59.4 kg), SpO2 99 %.  GEN:  Well nourished, well developed in no acute distress HEENT: Normal NECK: No JVD; No carotid bruits LYMPHATICS: No lymphadenopathy CARDIAC: RRR , no murmurs RESPIRATORY:  Clear to auscultation without rales, wheezing or rhonchi  ABDOMEN: Soft, non-tender, non-distended MUSCULOSKELETAL:  No edema; No deformity  SKIN: Warm and dry NEUROLOGIC:  Alert and oriented x 3   ASSESSMENT:    1. Mixed hyperlipidemia    PLAN:     1.  Chest discomfort: No further episodes of chest discomfort.  Coronary CT angiogram was negative for coronary artery disease.  2.  Hyperlipidemia: The coronary CT Angie Cheree Ditto was negative for CAD.  She was found to have minimal coronary artery calcifications.  Her coronary calcium score was 4. Given the fact that she does have some calcium on her coronary arteries, and she has a family history of coronary artery disease in her father and brother we will start rosuvastatin 10 mg a day. We will see her back in 3 to 4 months.  We will check repeat lipid profile, liver enzymes, basic metabolic profile and NMR Lipid profile a week or so prior to   3.  Hypertension:   BP is well controlled    Medication Adjustments/Labs and Tests Ordered: Current medicines are reviewed at length with the patient today.  Concerns regarding medicines are outlined above.  Orders Placed This Encounter  Procedures  . NMR LipoProf + Graph  . Hepatic function panel  . Basic Metabolic Panel (BMET)   Meds ordered this encounter  Medications  . rosuvastatin (CRESTOR) 10 MG tablet    Sig: Take 1 tablet (10  mg total) by mouth daily.    Dispense:  30 tablet    Refill:  11    Signed, Kristeen Miss, MD  10/08/2017 9:53 AM    [facility]  

## 2017-10-08 NOTE — Patient Instructions (Signed)
Medication Instructions:  Your physician has recommended you make the following change in your medication:   START Rosuvastatin (Crestor) 10 mg once daily   Labwork: Your physician recommends that you return for lab work in: 4 months on the day of or a few days before your office visit with Dr. Elease Hashimoto.  You will need to FAST for this appointment - nothing to eat or drink after midnight the night before except water.   Testing/Procedures: None Ordered   Follow-Up: Your physician recommends that you schedule a follow-up appointment in: 4 months with Dr. Elease Hashimoto   If you need a refill on your cardiac medications before your next appointment, please call your pharmacy.   Thank you for choosing [facility]! Eligha Bridegroom, RN [phone]

## 2017-10-16 DIAGNOSIS — R300 Dysuria: Secondary | ICD-10-CM | POA: Diagnosis not present

## 2018-01-29 ENCOUNTER — Other Ambulatory Visit: Payer: 59

## 2018-02-04 ENCOUNTER — Ambulatory Visit: Payer: 59 | Admitting: Cardiovascular Disease

## 2018-02-17 DIAGNOSIS — Z23 Encounter for immunization: Secondary | ICD-10-CM | POA: Diagnosis not present

## 2018-04-11 IMAGING — CT CT HEART MORP W/ CTA COR W/ SCORE W/ CA W/CM &/OR W/O CM
4 of 7 series · 8 of 20 positions shown, 9 images · IV contrast (APPLIED)
Comparison: None.

CLINICAL DATA: Chest pain

EXAM:
Cardiac CTA
MEDICATIONS:
Sub lingual nitro. 4mg x 2
TECHNIQUE: The patient was scanned on a [employer] [REDACTED]ice scanner. Gantry
rotation speed was 250 msecs. Collimation was 0.6 mm. A 100 kV
prospective scan was triggered in the ascending thoracic aorta at
35-75% of the R-R interval. Average HR during the scan was 60 bpm.
The 3D data set was interpreted on a dedicated work station using
MPR, MIP and VRT modes. A total of 80cc of contrast was used.

[Series 6: best diast 74 % · axial · 0.28mm/px · z∈[+1127,+1179]mm · 2 of 388 slices shown, 3 images]
[im 130/388  vessel]
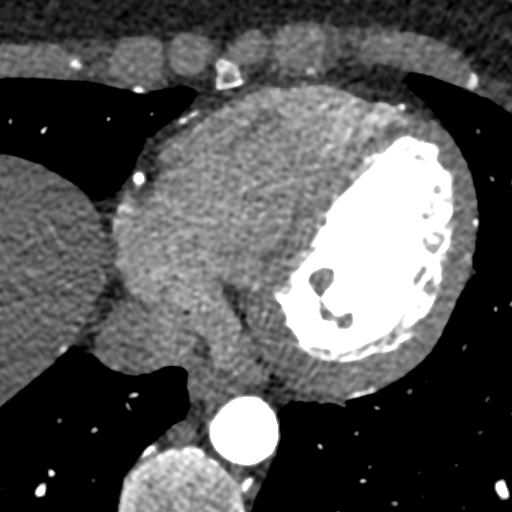
[im 130/388  lung]
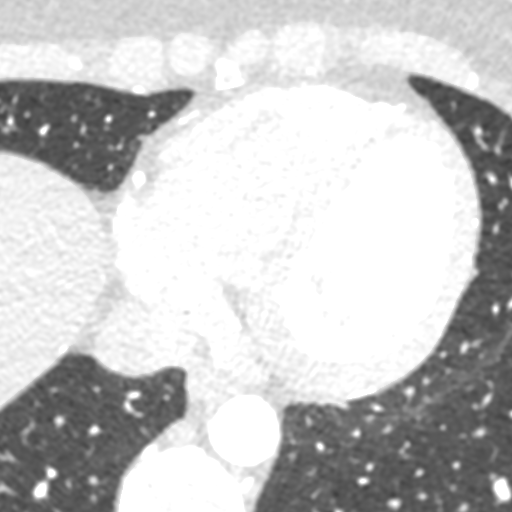
[im 259/388  vessel]
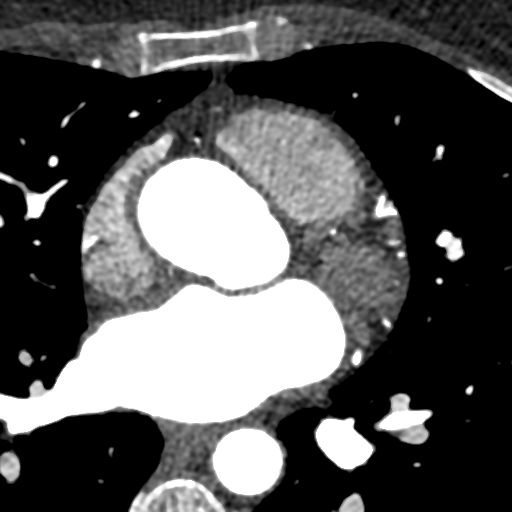

[Series 7: best syst 47 % · axial · 0.28mm/px · z∈[+1127,+1179]mm · 2 of 388 slices shown]
[im 130/388  vessel]
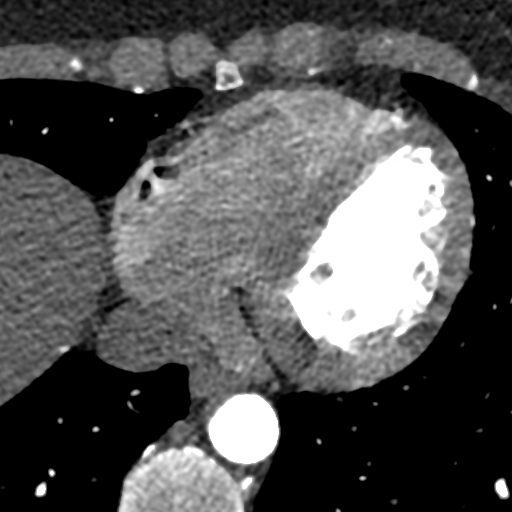
[im 259/388  vessel]
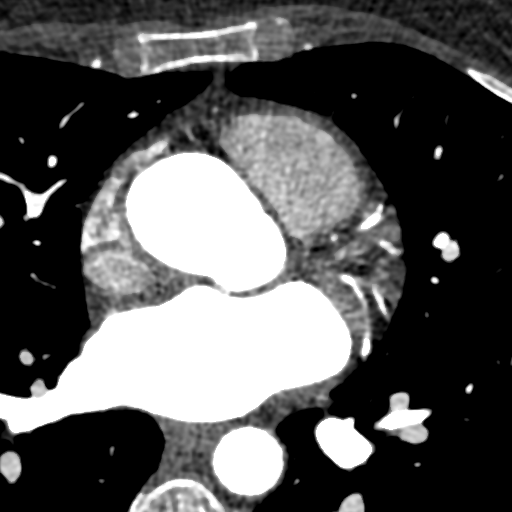

[Series 8: ts diast sharp 74 % · axial · 0.28mm/px · z∈[+1127,+1179]mm · 2 of 388 slices shown]
[im 130/388  lung]
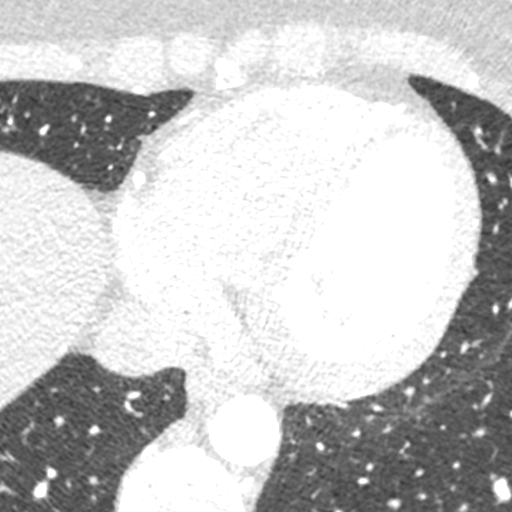
[im 259/388  lung]
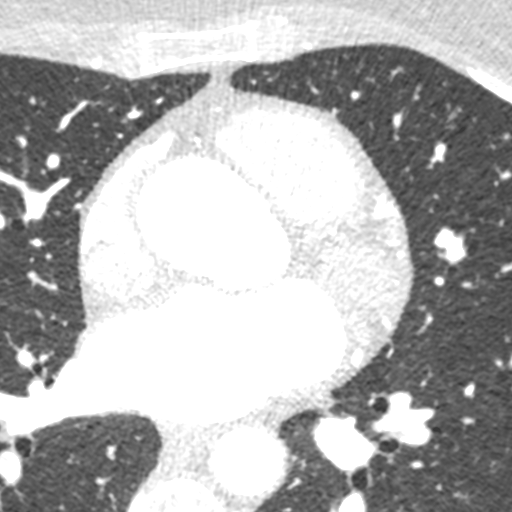

[Series 9: ts syst sharp 47 % · axial · 0.28mm/px · z∈[+1127,+1179]mm · 2 of 388 slices shown]
[im 130/388  lung]
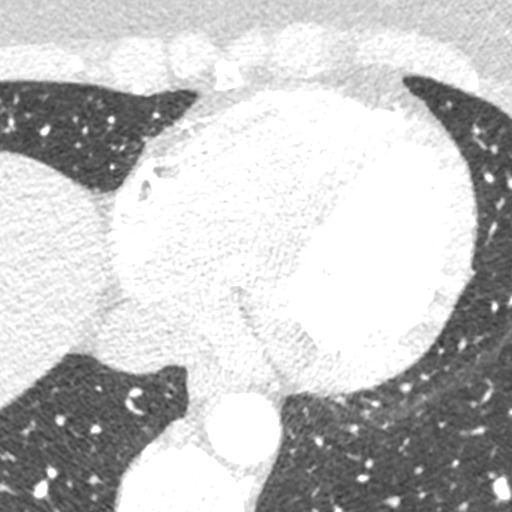
[im 259/388  lung]
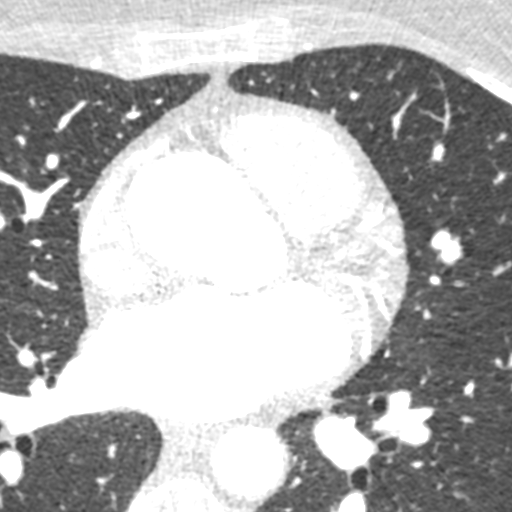

[8 of 20 positions shown; findings below may reference images not displayed]

FINDINGS: Non-cardiac: See separate report from [REDACTED].

Calcium Score: 4 Agatston units.

Coronary Arteries: Right dominant with no anomalies

LM: No plaque or stenosis.

LAD system: Calcified plaque mid LAD without significant stenosis.

Circumflex system: No plaque or stenosis.

RCA system: No plaque or stenosis.
IMPRESSION: 1. Coronary artery calcium score 4 Agatston units, placing the
patient in the 72nd percentile for age and gender, suggesting
intermediate risk for future cardiac events.

2.  No significant stenosis in the coronary arteries.

Jirouch Keprta

EXAM:
OVER-READ INTERPRETATION  CT CHEST

The following report is an over-read performed by radiologist Dr.
Chardonnay Chen [REDACTED] on 08/05/2017. This over-read
does not include interpretation of cardiac or coronary anatomy or
pathology. The coronary CTA interpretation by the cardiologist is
attached.
FINDINGS: Vascular: Visualized aorta normal caliber.  Heart is normal size.

Mediastinum/Nodes: No adenopathy in the lower mediastinum or hila.

Lungs/Pleura: Visualized lungs clear.  No effusions.

Upper Abdomen: Imaging into the upper abdomen shows no acute
findings.

Musculoskeletal: Chest wall soft tissues are unremarkable. No acute
bony abnormality.
IMPRESSION: No acute or significant extracardiac abnormality.

## 2019-05-13 LAB — HM PAP SMEAR

## 2019-05-13 LAB — RESULTS CONSOLE HPV: CHL HPV: NEGATIVE

## 2020-01-19 DIAGNOSIS — Z1231 Encounter for screening mammogram for malignant neoplasm of breast: Secondary | ICD-10-CM | POA: Diagnosis not present

## 2020-01-19 DIAGNOSIS — Z6822 Body mass index (BMI) 22.0-22.9, adult: Secondary | ICD-10-CM | POA: Diagnosis not present

## 2020-01-19 DIAGNOSIS — Z01419 Encounter for gynecological examination (general) (routine) without abnormal findings: Secondary | ICD-10-CM | POA: Diagnosis not present

## 2020-07-18 DIAGNOSIS — N898 Other specified noninflammatory disorders of vagina: Secondary | ICD-10-CM | POA: Diagnosis not present

## 2020-08-03 DIAGNOSIS — N950 Postmenopausal bleeding: Secondary | ICD-10-CM | POA: Diagnosis not present

## 2020-11-07 ENCOUNTER — Ambulatory Visit: Payer: BC Managed Care – PPO | Admitting: Family Medicine

## 2020-11-08 ENCOUNTER — Encounter: Payer: Self-pay | Admitting: Family Medicine

## 2020-11-08 ENCOUNTER — Ambulatory Visit (INDEPENDENT_AMBULATORY_CARE_PROVIDER_SITE_OTHER): Payer: BC Managed Care – PPO | Admitting: Family Medicine

## 2020-11-08 ENCOUNTER — Other Ambulatory Visit: Payer: Self-pay

## 2020-11-08 VITALS — BP 142/88 | HR 94 | Temp 98.1°F | Wt 138.8 lb

## 2020-11-08 DIAGNOSIS — E785 Hyperlipidemia, unspecified: Secondary | ICD-10-CM

## 2020-11-08 DIAGNOSIS — Z130 Encounter for screening for diseases of the blood and blood-forming organs and certain disorders involving the immune mechanism: Secondary | ICD-10-CM

## 2020-11-08 DIAGNOSIS — Z131 Encounter for screening for diabetes mellitus: Secondary | ICD-10-CM | POA: Diagnosis not present

## 2020-11-08 DIAGNOSIS — Z23 Encounter for immunization: Secondary | ICD-10-CM | POA: Diagnosis not present

## 2020-11-08 DIAGNOSIS — R7989 Other specified abnormal findings of blood chemistry: Secondary | ICD-10-CM

## 2020-11-08 DIAGNOSIS — Z0000 Encounter for general adult medical examination without abnormal findings: Secondary | ICD-10-CM | POA: Diagnosis not present

## 2020-11-08 DIAGNOSIS — Z1329 Encounter for screening for other suspected endocrine disorder: Secondary | ICD-10-CM | POA: Diagnosis not present

## 2020-11-08 DIAGNOSIS — Z1211 Encounter for screening for malignant neoplasm of colon: Secondary | ICD-10-CM | POA: Diagnosis not present

## 2020-11-08 DIAGNOSIS — I341 Nonrheumatic mitral (valve) prolapse: Secondary | ICD-10-CM

## 2020-11-08 NOTE — Patient Instructions (Signed)
Thank you for coming in today.  I will check some labs and then can discuss options for cholesterol treatment if needed based on those readings.  Tdap vaccine updated today.  I also ordered Cologuard for colon cancer screening.  Continue routine follow-up with OB/GYN.  Let me know if there are questions. Keeping You Healthy  Get These Tests Blood Pressure- Have your blood pressure checked by your healthcare provider at least once a year.  Normal blood pressure is 120/80. Weight- Have your body mass index (BMI) calculated to screen for obesity.  BMI is a measure of body fat based on height and weight.  You can calculate your own BMI at https://www.west-esparza.com/ Cholesterol- Have your cholesterol checked every year. Diabetes- Have your blood sugar checked every year if you have high blood pressure, high cholesterol, a family history of diabetes or if you are overweight. Pap Test - Have a pap test every 1 to 5 years if you have been sexually active.  If you are older than 65 and recent pap tests have been normal you may not need additional pap tests.  In addition, if you have had a hysterectomy  for benign disease additional pap tests are not necessary. Mammogram-Yearly mammograms are essential for early detection of breast cancer Screening for Colon Cancer- Colonoscopy starting at age 44. Screening may begin sooner depending on your family history and other health conditions.  Follow up colonoscopy as directed by your Gastroenterologist. Screening for Osteoporosis- Screening begins at age 87 with bone density scanning, sooner if you are at higher risk for developing Osteoporosis.  Get these medicines Calcium with Vitamin D- Your body requires 1200-1500 mg of Calcium a day and [phone] IU of Vitamin D a day.  You can only absorb 500 mg of Calcium at a time therefore Calcium must be taken in 2 or 3 separate doses throughout the day. Hormones- Hormone therapy has been associated with increased risk for  certain cancers and heart disease.  Talk to your healthcare provider about if you need relief from menopausal symptoms.  Get these Immuniztions Flu shot- Every fall Pneumonia shot- Once after the age of 37; if you are younger ask your healthcare provider if you need a pneumonia shot. Tetanus- Every ten years.  Take these steps Don't smoke- Your healthcare provider can help you quit. For tips on how to quit, ask your healthcare provider or go to www.smokefree.gov or call 1-800 QUIT-NOW. Be physically active- Exercise 5 days a week for a minimum of 30 minutes.  If you are not already physically active, start slow and gradually work up to 30 minutes of moderate physical activity.  Try walking, dancing, bike riding, swimming, etc. Eat a healthy diet- Eat a variety of healthy foods such as fruits, vegetables, whole grains, low fat milk, low fat cheeses, yogurt, lean meats, chicken, fish, eggs, dried beans, tofu, etc.  For more information go to www.thenutritionsource.org Dental visit- Brush and floss teeth twice daily; visit your dentist twice a year. Eye exam- Visit your Optometrist or Ophthalmologist yearly. Drink alcohol in moderation- Limit alcohol intake to one drink or less a day.  Never drink and drive. Depression- Your emotional health is as important as your physical health.  If you're feeling down or losing interest in things you normally enjoy, please talk to your healthcare provider. Seat Belts- can save your life; always wear one Smoke/Carbon Monoxide detectors- These detectors need to be installed on the appropriate level of your home.  Replace batteries at least  once a year. Violence- If anyone is threatening or hurting you, please tell your healthcare provider. Living Will/ Health care power of attorney- Discuss with your healthcare provider and family.

## 2020-11-08 NOTE — Progress Notes (Signed)
Subjective:  Patient ID: [name], female    DOB: 1960/09/07  Age: 60 y.o. MRN: [id number]  CC:  Chief Complaint  Patient presents with   New Patient (Initial Visit)    Previously seen by Dr. Hyacinth Meeker, requesting CPE today - no issues or concerns to discuss     HPI [name] presents for  New patient to establish care.  Previous primary care provider Dr. Sigmund Hazel.  History of mitral valve prolapse, previously followed by cardiology. Noted as young child. No symptoms.   Seen by cardiology Dr. Elease Hashimoto in 09/2017.  Echo 2019 - normal LV fxn, mild MR, TR.   Family history of hypertension, thyroid disease,  depression in her mother,  kidney disease, hyperlipidemia, heart disease, diabetes, depression in her father. Father with cardiac issues in late 58's, early 83's.   Hyperlipidemia Previously treated with Crestor - prescribed in 2019 by Dr. Elease Hashimoto, took for 1 week - had heartburn - had to stop after 1 week. CCS 08/05/17 - score of 4. Calcified plaque in mid LAD, no significant stenosis.  Last ate about 6 hours ago.  The ASCVD Risk score Natalie George DC Jr., et al., 2013) failed to calculate for the following reasons:   Cannot find a previous HDL lab   Cannot find a previous total cholesterol lab  No tobacco. Min alcohol -one in about 3 months.    Lab Results  Component Value Date   CHOL 274 (H) 06/16/2017   HDL 77 06/16/2017   LDLCALC 175 (H) 06/16/2017   TRIG 109 06/16/2017   CHOLHDL 3.6 06/16/2017   Lab Results  Component Value Date   ALT 33 (H) 06/16/2017   AST 30 06/16/2017   ALKPHOS 63 06/16/2017   BILITOT 0.6 06/16/2017   Exercise - yardwork, and gym3-4 days/week.   Cancer screening: Colon: cologuard about 4 years ago - normal. No FH of colon CA. Chooses Cologuard.  Mammogram in 2021.  [place] is obgyn. Pap  in 2021.  There is no immunization history for the selected administration types on file for this patient. Last tetanus over 10 yrs ago Shingrix few  months ago at pharmacy - x2.  Covid vaccine - received [employer], booster in past 6 months.    No results found. Sees optho - yearly.   Dentist every 23months.   Blood pressure always under 130/80 at home.   History There are no problems to display for this patient.  Past Medical History:  Diagnosis Date   Mitral valve prolapse    Seen by [facility]. this year, no test/no F/U required   Seasonal allergies    Past Surgical History:  Procedure Laterality Date   SVD     x 3   WISDOM TOOTH EXTRACTION     No Known Allergies Prior to Admission medications   Medication Sig Start Date End Date Taking? Authorizing Provider  Calcium Carb-Cholecalciferol (CALCIUM-VITAMIN D) 500-200 MG-UNIT tablet Take 1 tablet by mouth daily.   Yes [provider]  Cholecalciferol (VITAMIN D3) 125 MCG (5000 UT) CAPS Take by mouth.   Yes [provider]  CRANBERRY PO Take by mouth.   Yes [provider]  Cyanocobalamin (VITAMIN B-12 PO) Take 100 mcg by mouth.   Yes [provider]  estradiol (VIVELLE-DOT) 0.05 MG/24HR Place 1 patch onto the skin once a week. Sunday/Wednesday     Yes [provider]  KRILL OIL PO Take by mouth.   Yes [provider]  Omega-3 Fatty  Acids (OMEGA 3 PO) Take by mouth.   Yes [provider]  Probiotic Product (PROBIOTIC PO) Take by mouth.   Yes [provider]  progesterone (PROMETRIUM) 100 MG capsule Take 100 mg by mouth daily.     Yes [provider]  rosuvastatin (CRESTOR) 10 MG tablet Take 1 tablet (10 mg total) by mouth daily. 10/08/17 01/06/18  Nahser, Deloris Ping, MD   Social History   Socioeconomic History   Marital status: Married    Spouse name: Not on file   Number of children: Not on file   Years of education: Not on file   Highest education level: Not on file  Occupational History   Not on file  Tobacco Use   Smoking status: Never   Smokeless tobacco: Never  Vaping Use    Vaping Use: Never used  Substance and Sexual Activity   Alcohol use: Yes    Alcohol/week: 3.0 standard drinks    Types: 3 Glasses of wine per week    Comment: Max per month   Drug use: No   Sexual activity: Yes    Birth control/protection: Post-menopausal  Other Topics Concern   Not on file  Social History Narrative   Not on file   Social Determinants of Health   Financial Resource Strain: Not on file  Food Insecurity: Not on file  Transportation Needs: Not on file  Physical Activity: Not on file  Stress: Not on file  Social Connections: Not on file  Intimate Partner Violence: Not on file    Review of Systems   Objective:   Vitals:   11/08/20 1520  BP: (!) 142/88  Pulse: 94  Temp: 98.1 F (36.7 C)  TempSrc: Temporal  SpO2: 96%  Weight: 138 lb 12.8 oz (63 kg)     Physical Exam Vitals reviewed.  Constitutional:      Appearance: She is well-developed.  HENT:     Head: Normocephalic and atraumatic.     Right Ear: External ear normal.     Left Ear: External ear normal.  Eyes:     Conjunctiva/sclera: Conjunctivae normal.     Pupils: Pupils are equal, round, and reactive to light.  Neck:     Thyroid: No thyromegaly.  Cardiovascular:     Rate and Rhythm: Normal rate and regular rhythm.     Heart sounds: Normal heart sounds. No murmur heard.    Comments: Possible faint midsystolic click.  No apparent murmur otherwise. Pulmonary:     Effort: Pulmonary effort is normal. No respiratory distress.     Breath sounds: Normal breath sounds. No wheezing.  Abdominal:     General: Bowel sounds are normal.     Palpations: Abdomen is soft.     Tenderness: There is no abdominal tenderness.  Musculoskeletal:        General: No tenderness. Normal range of motion.     Cervical back: Normal range of motion and neck supple.  Lymphadenopathy:     Cervical: No cervical adenopathy.  Skin:    General: Skin is warm and dry.     Findings: No rash.  Neurological:     Mental  Status: She is alert and oriented to person, place, and time.  Psychiatric:        Behavior: Behavior normal.        Thought Content: Thought content normal.       Assessment & Plan:  [name] is a 60 y.o. female . Annual physical exam  - -anticipatory  guidance as below in AVS, screening labs above. Health maintenance items as above in HPI discussed/recommended as applicable.   Colon cancer screening - Plan: Cologuard  Elevated LFTs  -Single borderline elevated LFT previously, repeat labs  Hyperlipidemia, unspecified hyperlipidemia type - Plan: Comprehensive metabolic panel, Lipid panel  -Minimal elevated coronary calcium scoring as above.  No apparent early family history of cardiac disease.  Did not tolerate daily statin.  Repeat lipid panel, ASCVD risk scoring, then can decide on options including possible intermittent statin dosing.  Screening for diabetes mellitus - Plan: Hemoglobin A1c  MVP (mitral valve prolapse)  -Asymptomatic with trace mitral regurgitation, tricuspid regurgitation on prior echo.  No other concerns.  Need for diphtheria-tetanus-pertussis (Tdap) vaccine - Plan: Tdap vaccine greater than or equal to 7yo IM  Screening for thyroid disorder - Plan: TSH  Screening, deficiency anemia, iron - Plan: CBC   No orders of the defined types were placed in this encounter.  Patient Instructions  Thank you for coming in today.  I will check some labs and then can discuss options for cholesterol treatment if needed based on those readings.  Tdap vaccine updated today.  I also ordered Cologuard for colon cancer screening.  Continue routine follow-up with OB/GYN.  Let me know if there are questions. Keeping You Healthy  Get These Tests Blood Pressure- Have your blood pressure checked by your healthcare provider at least once a year.  Normal blood pressure is 120/80. Weight- Have your body mass index (BMI) calculated to screen for obesity.  BMI is a measure of  body fat based on height and weight.  You can calculate your own BMI at https://www.west-esparza.com/www.nhlbisupport.com/bmi/ Cholesterol- Have your cholesterol checked every year. Diabetes- Have your blood sugar checked every year if you have high blood pressure, high cholesterol, a family history of diabetes or if you are overweight. Pap Test - Have a pap test every 1 to 5 years if you have been sexually active.  If you are older than 65 and recent pap tests have been normal you may not need additional pap tests.  In addition, if you have had a hysterectomy  for benign disease additional pap tests are not necessary. Mammogram-Yearly mammograms are essential for early detection of breast cancer Screening for Colon Cancer- Colonoscopy starting at age 61. Screening may begin sooner depending on your family history and other health conditions.  Follow up colonoscopy as directed by your Gastroenterologist. Screening for Osteoporosis- Screening begins at age 61 with bone density scanning, sooner if you are at higher risk for developing Osteoporosis.  Get these medicines Calcium with Vitamin D- Your body requires 1200-1500 mg of Calcium a day and [phone] IU of Vitamin D a day.  You can only absorb 500 mg of Calcium at a time therefore Calcium must be taken in 2 or 3 separate doses throughout the day. Hormones- Hormone therapy has been associated with increased risk for certain cancers and heart disease.  Talk to your healthcare provider about if you need relief from menopausal symptoms.  Get these Immuniztions Flu shot- Every fall Pneumonia shot- Once after the age of 265; if you are younger ask your healthcare provider if you need a pneumonia shot. Tetanus- Every ten years.  Take these steps Don't smoke- Your healthcare provider can help you quit. For tips on how to quit, ask your healthcare provider or go to www.smokefree.gov or call 1-800 QUIT-NOW. Be physically active- Exercise 5 days a week for a minimum of 30 minutes.  If  you are not already physically active, start slow and gradually work up to 30 minutes of moderate physical activity.  Try walking, dancing, bike riding, swimming, etc. Eat a healthy diet- Eat a variety of healthy foods such as fruits, vegetables, whole grains, low fat milk, low fat cheeses, yogurt, lean meats, chicken, fish, eggs, dried beans, tofu, etc.  For more information go to www.thenutritionsource.org Dental visit- Brush and floss teeth twice daily; visit your dentist twice a year. Eye exam- Visit your Optometrist or Ophthalmologist yearly. Drink alcohol in moderation- Limit alcohol intake to one drink or less a day.  Never drink and drive. Depression- Your emotional health is as important as your physical health.  If you're feeling down or losing interest in things you normally enjoy, please talk to your healthcare provider. Seat Belts- can save your life; always wear one Smoke/Carbon Monoxide detectors- These detectors need to be installed on the appropriate level of your home.  Replace batteries at least once a year. Violence- If anyone is threatening or hurting you, please tell your healthcare provider. Living Will/ Health care power of attorney- Discuss with your healthcare provider and family.    Signed,   Meredith Staggers, MD [facility], [facility] 11/08/20 4:26 PM

## 2020-11-09 LAB — LIPID PANEL
Cholesterol: 284 mg/dL — ABNORMAL HIGH (ref 0–200)
HDL: 63.7 mg/dL (ref >39.00)
LDL Cholesterol: 182 mg/dL — ABNORMAL HIGH (ref 0–99)
NonHDL: 220.72
Total CHOL/HDL Ratio: 4
Triglycerides: 192 mg/dL — ABNORMAL HIGH (ref 0.0–149.0)
VLDL: 38.4 mg/dL (ref 0.0–40.0)

## 2020-11-09 LAB — COMPREHENSIVE METABOLIC PANEL
ALT: 38 U/L — ABNORMAL HIGH (ref 0–35)
AST: 34 U/L (ref 0–37)
Albumin: 4.6 g/dL (ref 3.5–5.2)
Alkaline Phosphatase: 82 U/L (ref 39–117)
BUN: 17 mg/dL (ref 6–23)
CO2: 28 mEq/L (ref 19–32)
Calcium: 9.8 mg/dL (ref 8.4–10.5)
Chloride: 102 mEq/L (ref 96–112)
Creatinine, Ser: 0.75 mg/dL (ref 0.40–1.20)
GFR: 85.9 mL/min (ref >60.00)
Glucose, Bld: 103 mg/dL — ABNORMAL HIGH (ref 70–99)
Potassium: 4.2 mEq/L (ref 3.5–5.1)
Sodium: 138 mEq/L (ref 135–145)
Total Bilirubin: 0.3 mg/dL (ref 0.2–1.2)
Total Protein: 7 g/dL (ref 6.0–8.3)

## 2020-11-09 LAB — CBC
HCT: 45 % (ref 36.0–46.0)
Hemoglobin: 15.2 g/dL — ABNORMAL HIGH (ref 12.0–15.0)
MCHC: 33.8 g/dL (ref 30.0–36.0)
MCV: 93.5 fl (ref 78.0–100.0)
Platelets: 250 10*3/uL (ref 150.0–400.0)
RBC: 4.81 Mil/uL (ref 3.87–5.11)
RDW: 12.9 % (ref 11.5–15.5)
WBC: 8.4 10*3/uL (ref 4.0–10.5)

## 2020-11-09 LAB — TSH: TSH: 1.88 u[IU]/mL (ref 0.35–5.50)

## 2020-11-09 LAB — HEMOGLOBIN A1C: Hgb A1c MFr Bld: 5.9 % (ref 4.6–6.5)

## 2021-03-11 DIAGNOSIS — Z6821 Body mass index (BMI) 21.0-21.9, adult: Secondary | ICD-10-CM | POA: Diagnosis not present

## 2021-03-11 DIAGNOSIS — Z01419 Encounter for gynecological examination (general) (routine) without abnormal findings: Secondary | ICD-10-CM | POA: Diagnosis not present

## 2021-03-11 DIAGNOSIS — Z124 Encounter for screening for malignant neoplasm of cervix: Secondary | ICD-10-CM | POA: Diagnosis not present

## 2021-03-11 DIAGNOSIS — Z1231 Encounter for screening mammogram for malignant neoplasm of breast: Secondary | ICD-10-CM | POA: Diagnosis not present

## 2021-03-11 DIAGNOSIS — Z113 Encounter for screening for infections with a predominantly sexual mode of transmission: Secondary | ICD-10-CM | POA: Diagnosis not present

## 2021-03-19 DIAGNOSIS — Z1211 Encounter for screening for malignant neoplasm of colon: Secondary | ICD-10-CM | POA: Diagnosis not present

## 2021-03-19 DIAGNOSIS — Z1212 Encounter for screening for malignant neoplasm of rectum: Secondary | ICD-10-CM | POA: Diagnosis not present

## 2021-03-26 LAB — COLOGUARD: COLOGUARD: NEGATIVE

## 2021-03-26 LAB — EXTERNAL GENERIC LAB PROCEDURE: COLOGUARD: NEGATIVE

## 2021-11-22 ENCOUNTER — Ambulatory Visit (INDEPENDENT_AMBULATORY_CARE_PROVIDER_SITE_OTHER): Payer: BC Managed Care – PPO | Admitting: Family Medicine

## 2021-11-22 ENCOUNTER — Encounter: Payer: Self-pay | Admitting: Family Medicine

## 2021-11-22 VITALS — BP 118/68 | HR 72 | Temp 98.4°F | Resp 17 | Ht 65.0 in | Wt 138.6 lb

## 2021-11-22 DIAGNOSIS — Z1159 Encounter for screening for other viral diseases: Secondary | ICD-10-CM

## 2021-11-22 DIAGNOSIS — Z114 Encounter for screening for human immunodeficiency virus [HIV]: Secondary | ICD-10-CM | POA: Diagnosis not present

## 2021-11-22 DIAGNOSIS — R739 Hyperglycemia, unspecified: Secondary | ICD-10-CM

## 2021-11-22 DIAGNOSIS — E785 Hyperlipidemia, unspecified: Secondary | ICD-10-CM | POA: Diagnosis not present

## 2021-11-22 DIAGNOSIS — G8929 Other chronic pain: Secondary | ICD-10-CM

## 2021-11-22 DIAGNOSIS — M25561 Pain in right knee: Secondary | ICD-10-CM

## 2021-11-22 DIAGNOSIS — R7989 Other specified abnormal findings of blood chemistry: Secondary | ICD-10-CM

## 2021-11-22 DIAGNOSIS — D582 Other hemoglobinopathies: Secondary | ICD-10-CM | POA: Diagnosis not present

## 2021-11-22 DIAGNOSIS — Z0000 Encounter for general adult medical examination without abnormal findings: Secondary | ICD-10-CM | POA: Diagnosis not present

## 2021-11-22 LAB — COMPREHENSIVE METABOLIC PANEL
ALT: 17 U/L (ref 0–35)
AST: 25 U/L (ref 0–37)
Albumin: 5 g/dL (ref 3.5–5.2)
Alkaline Phosphatase: 49 U/L (ref 39–117)
BUN: 21 mg/dL (ref 6–23)
CO2: 28 mEq/L (ref 19–32)
Calcium: 10.7 mg/dL — ABNORMAL HIGH (ref 8.4–10.5)
Chloride: 103 mEq/L (ref 96–112)
Creatinine, Ser: 0.75 mg/dL (ref 0.40–1.20)
GFR: 85.27 mL/min (ref >60.00)
Glucose, Bld: 95 mg/dL (ref 70–99)
Potassium: 4 mEq/L (ref 3.5–5.1)
Sodium: 139 mEq/L (ref 135–145)
Total Bilirubin: 0.6 mg/dL (ref 0.2–1.2)
Total Protein: 7.6 g/dL (ref 6.0–8.3)

## 2021-11-22 LAB — LIPID PANEL
Cholesterol: 311 mg/dL — ABNORMAL HIGH (ref 0–200)
HDL: 75.3 mg/dL (ref >39.00)
LDL Cholesterol: 211 mg/dL — ABNORMAL HIGH (ref 0–99)
NonHDL: 235.66
Total CHOL/HDL Ratio: 4
Triglycerides: 125 mg/dL (ref 0.0–149.0)
VLDL: 25 mg/dL (ref 0.0–40.0)

## 2021-11-22 LAB — CBC
HCT: 46.1 % — ABNORMAL HIGH (ref 36.0–46.0)
Hemoglobin: 15 g/dL (ref 12.0–15.0)
MCHC: 32.6 g/dL (ref 30.0–36.0)
MCV: 94.7 fl (ref 78.0–100.0)
Platelets: 263 10*3/uL (ref 150.0–400.0)
RBC: 4.87 Mil/uL (ref 3.87–5.11)
RDW: 13 % (ref 11.5–15.5)
WBC: 7.8 10*3/uL (ref 4.0–10.5)

## 2021-11-22 LAB — HEMOGLOBIN A1C: Hgb A1c MFr Bld: 5.9 % (ref 4.6–6.5)

## 2021-11-22 MED ORDER — DICLOFENAC SODIUM 1 % EX CREA: 4.0000 g | TOPICAL_CREAM | Freq: Four times a day (QID) | CUTANEOUS | 2 refills | Status: DC | PRN

## 2021-11-22 NOTE — Patient Instructions (Addendum)
Depending on cholesterol readings, intermittent statin may be an option. I will check your other blood tests for the borderline readings from last year. X-ray for right knee at [facility], see location below.  Can use diclofenac topical up to 4 times per day for now that may help with pain and swelling.  Follow-up in 1 month to discuss knee pain further unless there are concerns in your x-ray.  Let me know if there are concerns or questions sooner.  Thank you for coming in today.  [facility] in 8:30-4:30 during weekdays, no appointment needed [place].  [place], [place] [address]   Preventive Care 43-62 Years Old, Female Preventive care refers to lifestyle choices and visits with your health care provider that can promote health and wellness. Preventive care visits are also called wellness exams. What can I expect for my preventive care visit? Counseling Your health care provider may ask you questions about your: Medical history, including: Past medical problems. Family medical history. Pregnancy history. Current health, including: Menstrual cycle. Method of birth control. Emotional well-being. Home life and relationship well-being. Sexual activity and sexual health. Lifestyle, including: Alcohol, nicotine or tobacco, and drug use. Access to firearms. Diet, exercise, and sleep habits. Work and work Statistician. Sunscreen use. Safety issues such as seatbelt and bike helmet use. Physical exam Your health care provider will check your: Height and weight. These may be used to calculate your BMI (body mass index). BMI is a measurement that tells if you are at a healthy weight. Waist circumference. This measures the distance around your waistline. This measurement also tells if you are at a healthy weight and may help predict your risk of certain diseases, such as type 2 diabetes and high blood pressure. Heart rate and blood pressure. Body temperature. Skin for abnormal  spots. What immunizations do I need?  Vaccines are usually given at various ages, according to a schedule. Your health care provider will recommend vaccines for you based on your age, medical history, and lifestyle or other factors, such as travel or where you work. What tests do I need? Screening Your health care provider may recommend screening tests for certain conditions. This may include: Lipid and cholesterol levels. Diabetes screening. This is done by checking your blood sugar (glucose) after you have not eaten for a while (fasting). Pelvic exam and Pap test. Hepatitis B test. Hepatitis C test. HIV (human immunodeficiency virus) test. STI (sexually transmitted infection) testing, if you are at risk. Lung cancer screening. Colorectal cancer screening. Mammogram. Talk with your health care provider about when you should start having regular mammograms. This may depend on whether you have a family history of breast cancer. BRCA-related cancer screening. This may be done if you have a family history of breast, ovarian, tubal, or peritoneal cancers. Bone density scan. This is done to screen for osteoporosis. Talk with your health care provider about your test results, treatment options, and if necessary, the need for more tests. Follow these instructions at home: Eating and drinking  Eat a diet that includes fresh fruits and vegetables, whole grains, lean protein, and low-fat dairy products. Take vitamin and mineral supplements as recommended by your health care provider. Do not drink alcohol if: Your health care provider tells you not to drink. You are pregnant, may be pregnant, or are planning to become pregnant. If you drink alcohol: Limit how much you have to 0-1 drink a day. Know how much alcohol is in your drink. In the [place]  one drink equals one 12 oz bottle of beer (355 mL), one 5 oz glass of wine (148 mL), or one 1 oz glass of hard liquor (44 mL). Lifestyle Brush your  teeth every morning and night with fluoride toothpaste. Floss one time each day. Exercise for at least 30 minutes 5 or more days each week. Do not use any products that contain nicotine or tobacco. These products include cigarettes, chewing tobacco, and vaping devices, such as e-cigarettes. If you need help quitting, ask your health care provider. Do not use drugs. If you are sexually active, practice safe sex. Use a condom or other form of protection to prevent STIs. If you do not wish to become pregnant, use a form of birth control. If you plan to become pregnant, see your health care provider for a prepregnancy visit. Take aspirin only as told by your health care provider. Make sure that you understand how much to take and what form to take. Work with your health care provider to find out whether it is safe and beneficial for you to take aspirin daily. Find healthy ways to manage stress, such as: Meditation, yoga, or listening to music. Journaling. Talking to a trusted person. Spending time with friends and family. Minimize exposure to UV radiation to reduce your risk of skin cancer. Safety Always wear your seat belt while driving or riding in a vehicle. Do not drive: If you have been drinking alcohol. Do not ride with someone who has been drinking. When you are tired or distracted. While texting. If you have been using any mind-altering substances or drugs. Wear a helmet and other protective equipment during sports activities. If you have firearms in your house, make sure you follow all gun safety procedures. Seek help if you have been physically or sexually abused. What's next? Visit your health care provider once a year for an annual wellness visit. Ask your health care provider how often you should have your eyes and teeth checked. Stay up to date on all vaccines. This information is not intended to replace advice given to you by your health care provider. Make sure you discuss any  questions you have with your health care provider. Document Revised: 10/24/2020 Document Reviewed: 10/24/2020 Elsevier Patient Education  [place].  Acute Knee Pain, Adult Acute knee pain is sudden and may be caused by damage, swelling, or irritation of the muscles and tissues that support the knee. Pain may result from: A fall. An injury to the knee from twisting motions. A hit to the knee. Infection. Acute knee pain may go away on its own with time and rest. If it does not, your health care provider may order tests to find the cause of the pain. These may include: Imaging tests, such as an X-ray, MRI, CT scan, or ultrasound. Joint aspiration. In this test, fluid is removed from the knee and evaluated. Arthroscopy. In this test, a lighted tube is inserted into the knee and an image is projected onto a TV screen. Biopsy. In this test, a sample of tissue is removed from the body and studied under a microscope. Follow these instructions at home: If you have a knee sleeve or brace:  Wear the knee sleeve or brace as told by your health care provider. Remove it only as told by your health care provider. Loosen it if your toes tingle, become numb, or turn cold and blue. Keep it clean. If the knee sleeve or brace is not waterproof: Do not let it  get wet. Cover it with a watertight covering when you take a bath or shower. Activity Rest your knee. Do not do things that cause pain or make pain worse. Avoid high-impact activities or exercises, such as running, jumping rope, or doing jumping jacks. Work with a physical therapist to make a safe exercise program, as recommended by your health care provider. Do exercises as told by your physical therapist. Managing pain, stiffness, and swelling  If directed, put ice on the affected knee. To do this: If you have a removable knee sleeve or brace, remove it as told by your health care provider. Put ice in a plastic bag. Place a towel  between your skin and the bag. Leave the ice on for 20 minutes, 2-3 times a day. Remove the ice if your skin turns bright red. This is very important. If you cannot feel pain, heat, or cold, you have a greater risk of damage to the area. If directed, use an elastic bandage to put pressure (compression) on your injured knee. This may control swelling, give support, and help with discomfort. Raise (elevate) your knee above the level of your heart while you are sitting or lying down. Sleep with a pillow under your knee. General instructions Take over-the-counter and prescription medicines only as told by your health care provider. Do not use any products that contain nicotine or tobacco, such as cigarettes, e-cigarettes, and chewing tobacco. If you need help quitting, ask your health care provider. If you are overweight, work with your health care provider and a dietitian to set a weight-loss goal that is healthy and reasonable for you. Extra weight can put pressure on your knee. Pay attention to any changes in your symptoms. Keep all follow-up visits. This is important. Contact a health care provider if: Your knee pain continues, changes, or gets worse. You have a fever along with knee pain. Your knee feels warm to the touch or is red. Your knee buckles or locks up. Get help right away if: Your knee swells, and the swelling becomes worse. You cannot move your knee. You have severe pain in your knee that cannot be managed with pain medicine. Summary Acute knee pain can be caused by a fall, an injury, an infection, or damage, swelling, or irritation of the tissues that support your knee. Your health care provider may perform tests to find out the cause of the pain. Pay attention to any changes in your symptoms. Relieve your pain with rest, medicines, light activity, and the use of ice. Get help right away if your knee swells, you cannot move your knee, or you have severe pain that cannot be  managed with medicine. This information is not intended to replace advice given to you by your health care provider. Make sure you discuss any questions you have with your health care provider. Document Revised: 10/12/2019 Document Reviewed: 10/12/2019 Elsevier Patient Education  [place].

## 2021-11-22 NOTE — Progress Notes (Unsigned)
Subjective:  Patient ID: [name], female    DOB: 06/20/1960  Age: 61 y.o. MRN: [id number]  CC:  Chief Complaint  Patient presents with   Annual Exam    Pt is fasting for labs brought up knee concern but states can make separate appt     HPI Tyniah Kastens presents for Annual Exam Care team PCP, me Cardiology Dr. Elease Hashimoto.  Mitral valve prolapse. Gynecology, Dr. Billy Coast  No medical changes since last visit.    Family history of hypertension, thyroid disease and depression in her mother, kidney disease, hyperlipidemia, heart disease, diabetes and depression in father.  Father's cardiac issues were in his late 72s and early 10s.  Hyperlipidemia: Discussed last year.  Heartburn with Crestor, only took 1 week.  Coronary calcium scoring in 2019 with a score of 4 with calcified plaque in the mid LAD but no significant stenosis.  Low ASCVD risk score although LDL was high last visit.   intermittent statin as an option discussed.  Not taking meds for cholesterol at this time.  Borderline LFT at that time as well, as well as elevated hemoglobin, blood sugar. More exercise and better about diet.   The 10-year ASCVD risk score (Arnett DK, et al., 2019) is: 4%   Values used to calculate the score:     Age: 89 years     Sex: Female     Is Non-Hispanic African American: No     Diabetic: No     Tobacco smoker: No     Systolic Blood Pressure: 118 mmHg     Is BP treated: No     HDL Cholesterol: 63.7 mg/dL     Total Cholesterol: 284 mg/dL  Lab Results  Component Value Date   CHOL 284 (H) 11/08/2020   HDL 63.70 11/08/2020   LDLCALC 182 (H) 11/08/2020   TRIG 192.0 (H) 11/08/2020   CHOLHDL 4 11/08/2020   Lab Results  Component Value Date   ALT 38 (H) 11/08/2020   AST 34 11/08/2020   ALKPHOS 82 11/08/2020   BILITOT 0.3 11/08/2020   Prediabetes: Elevated A1c last year.  Diet/exercise approach discussed.   Lab Results  Component Value Date   HGBA1C 5.9 11/08/2020   Wt Readings  from Last 3 Encounters:  11/08/20 138 lb 12.8 oz (63 kg)  10/08/17 131 lb (59.4 kg)  06/16/17 134 lb 9.6 oz (61.1 kg)   R Knee pain: Few years of swelling at times. Trip about 10 years with fall. Stuck knee at that time.  Swelling with walking at times. Moved to treadmill as easier than outside walking. Pain on inside and behind knee past month.  Sore pain behind knee with pushing. Tx: ice at times for swelling helps. Occasional advil. Neoprene brace.      11/08/2020    3:22 PM  Depression screen PHQ 2/9  Decreased Interest 0  Down, Depressed, Hopeless 0  PHQ - 2 Score 0    Health Maintenance  Topic Date Due   Zoster Vaccines- Shingrix (1 of 2) Never done   MAMMOGRAM  11/23/2022 (Originally 05/12/2021)   COLONOSCOPY (Pts 45-57yrs Insurance coverage will need to be confirmed)  11/23/2022 (Originally 05/20/2004)   Hepatitis C Screening  11/23/2022 (Originally 05/20/1977)   HIV Screening  11/23/2022 (Originally 05/20/1974)   INFLUENZA VACCINE  12/10/2021   PAP SMEAR-Modifier  05/12/2022   TETANUS/TDAP  11/09/2030   HPV VACCINES  Aged Out  Cologuard - 03/19/2021 Mammogram: last year, and appt soon with GYN Pap  testing with GYN - normal last year.   Immunization History  Administered Date(s) Administered   Tdap 11/08/2020  Reported Shingrix at her pharmacy last year.  COVID-vaccine x2 with booster last year.  Bivalent vaccine booster, has not had and declined at this time.   No results found. Followed by ophthalmology yearly  Dental: Every 6 months.  Alcohol:rare - every 3-6 months glass of wine.   Tobacco: none.   Exercise: Yard work, walking 4 miles 5 days per week, [place]. Goal exercise 5 days per week.    History There are no problems to display for this patient.  Past Medical History:  Diagnosis Date   Mitral valve prolapse    Seen by [facility]. this year, no test/no F/U required   Seasonal allergies    Past Surgical History:   Procedure Laterality Date   SVD     x 3   WISDOM TOOTH EXTRACTION     No Known Allergies Prior to Admission medications   Medication Sig Start Date End Date Taking? Authorizing Provider  Calcium Carb-Cholecalciferol (CALCIUM-VITAMIN D) 500-200 MG-UNIT tablet Take 1 tablet by mouth daily.   Yes [provider]  Cholecalciferol (VITAMIN D3) 125 MCG (5000 UT) CAPS Take by mouth.   Yes [provider]  CRANBERRY PO Take by mouth.   Yes [provider]  Cyanocobalamin (VITAMIN B-12 PO) Take 100 mcg by mouth.   Yes [provider]  estradiol (VIVELLE-DOT) 0.05 MG/24HR Place 1 patch onto the skin once a week. Sunday/Wednesday     Yes [provider]  Probiotic Product (PROBIOTIC PO) Take by mouth.   Yes [provider]  progesterone (PROMETRIUM) 100 MG capsule Take 100 mg by mouth daily.     Yes [provider]  KRILL OIL PO Take by mouth. Patient not taking: Reported on 11/22/2021    [provider]  Omega-3 Fatty Acids (OMEGA 3 PO) Take by mouth. Patient not taking: Reported on 11/22/2021    [provider]   Social History   Socioeconomic History   Marital status: Married    Spouse name: Not on file   Number of children: Not on file   Years of education: Not on file   Highest education level: Not on file  Occupational History   Not on file  Tobacco Use   Smoking status: Never   Smokeless tobacco: Never  Vaping Use   Vaping Use: Never used  Substance and Sexual Activity   Alcohol use: Yes    Alcohol/week: 3.0 standard drinks of alcohol    Types: 3 Glasses of wine per week    Comment: Max per month   Drug use: No   Sexual activity: Yes    Birth control/protection: Post-menopausal  Other Topics Concern   Not on file  Social History Narrative   Not on file   Social Determinants of Health   Financial Resource Strain: Not on file  Food Insecurity: Not on file  Transportation Needs: Not on file   Physical Activity: Not on file  Stress: Not on file  Social Connections: Not on file  Intimate Partner Violence: Not on file    Review of Systems 13 point review of systems per patient health survey noted.  Negative other than as indicated above or in HPI.    Objective:   Vitals:   11/22/21 1039  BP: 118/68  Pulse: 72  Resp: 17  Temp: 98.4 F (36.9 C)  TempSrc: Oral  SpO2: 98%   {Vitals History (Optional):23777}  Physical Exam Vitals reviewed.  Constitutional:      Appearance: She is well-developed.  HENT:     Head: Normocephalic and atraumatic.     Right Ear: External ear normal.     Left Ear: External ear normal.  Eyes:     Conjunctiva/sclera: Conjunctivae normal.     Pupils: Pupils are equal, round, and reactive to light.  Neck:     Thyroid: No thyromegaly.  Cardiovascular:     Rate and Rhythm: Normal rate and regular rhythm.     Heart sounds: Normal heart sounds. No murmur heard. Pulmonary:     Effort: Pulmonary effort is normal. No respiratory distress.     Breath sounds: Normal breath sounds. No wheezing.  Abdominal:     General: Bowel sounds are normal.     Palpations: Abdomen is soft.     Tenderness: There is no abdominal tenderness.  Musculoskeletal:        General: No tenderness. Normal range of motion.     Cervical back: Normal range of motion and neck supple.     Comments: Left knee, no effusion, skin intact, pain-free range of motion, negative Lachman, McMurray, varus and valgus testing.  Right knee, trace to 1+ effusion, skin intact, no erythema or wounds.  Minimal discomfort at the anterior aspect of the medial joint line.  Crepitus with McMurray but no specific pain.  Slight discomfort at terminal flexion.  Negative Lachman, varus, valgus testing.  Lymphadenopathy:     Cervical: No cervical adenopathy.  Skin:    General: Skin is warm and dry.     Findings: No rash.  Neurological:     Mental Status: She is alert and oriented to person,  place, and time.  Psychiatric:        Behavior: Behavior normal.        Thought Content: Thought content normal.        Assessment & Plan:  [name] is a 61 y.o. female . No diagnosis found.   No orders of the defined types were placed in this encounter.  There are no Patient Instructions on file for this visit.    Signed,   Merri Ray, MD [place] 11/22/21 10:58 AM

## 2021-11-25 ENCOUNTER — Telehealth: Payer: Self-pay | Admitting: Family Medicine

## 2021-11-25 LAB — HEPATITIS C ANTIBODY: Hepatitis C Ab: NONREACTIVE

## 2021-11-25 LAB — HIV ANTIBODY (ROUTINE TESTING W REFLEX): HIV 1&2 Ab, 4th Generation: NONREACTIVE

## 2021-11-25 NOTE — Telephone Encounter (Signed)
Pt last appt was 11/22/21. Pt states she had her labs done and her cholesterol was high. Pt states that her and Dr. Neva Seat discuss  medications that can help with her cholesterol. Pt agrees that she need medication. Pt want to know if Dr. Neva Seat can send her cholesterol medication in to [facility] on [address]

## 2021-11-25 NOTE — Telephone Encounter (Signed)
error 

## 2021-11-26 ENCOUNTER — Other Ambulatory Visit: Payer: Self-pay | Admitting: Family Medicine

## 2021-11-26 DIAGNOSIS — E785 Hyperlipidemia, unspecified: Secondary | ICD-10-CM

## 2021-11-26 MED ORDER — ATORVASTATIN CALCIUM 10 MG PO TABS: 10.0000 mg | ORAL_TABLET | Freq: Every day | ORAL | 0 refills | Status: DC

## 2021-11-26 NOTE — Telephone Encounter (Signed)
See lab notes

## 2021-11-26 NOTE — Progress Notes (Signed)
See lab notes

## 2021-11-29 ENCOUNTER — Ambulatory Visit (INDEPENDENT_AMBULATORY_CARE_PROVIDER_SITE_OTHER)
Admission: RE | Admit: 2021-11-29 | Discharge: 2021-11-29 | Disposition: A | Discharge status: Home / Self Care | Payer: BC Managed Care – PPO | Source: Ambulatory Visit | Attending: Family Medicine | Admitting: Family Medicine

## 2021-11-29 DIAGNOSIS — M1711 Unilateral primary osteoarthritis, right knee: Secondary | ICD-10-CM | POA: Diagnosis not present

## 2021-11-29 DIAGNOSIS — M25461 Effusion, right knee: Secondary | ICD-10-CM | POA: Diagnosis not present

## 2021-11-29 DIAGNOSIS — M25561 Pain in right knee: Secondary | ICD-10-CM

## 2021-11-29 DIAGNOSIS — G8929 Other chronic pain: Secondary | ICD-10-CM

## 2021-11-29 DIAGNOSIS — M7989 Other specified soft tissue disorders: Secondary | ICD-10-CM | POA: Diagnosis not present

## 2021-12-06 ENCOUNTER — Other Ambulatory Visit: Payer: Self-pay

## 2021-12-06 ENCOUNTER — Telehealth: Payer: Self-pay | Admitting: Family Medicine

## 2021-12-06 MED ORDER — ATORVASTATIN CALCIUM 10 MG PO TABS
10.0000 mg | ORAL_TABLET | Freq: Every day | ORAL | 3 refills | Status: DC
Start: 2021-12-06 — End: 2022-11-28

## 2021-12-06 NOTE — Telephone Encounter (Signed)
EDIT  Encourage patient to contact the pharmacy for refills or they can request refills through [facility]  (Please schedule appointment if patient has not been seen in over a year)    WHAT PHARMACY WOULD THEY LIKE THIS SENT TO: [facility] on [address] [phone]  MEDICATION NAME & DOSE: Lipitor 10 mg  NOTES/COMMENTS FROM PATIENT: Edited from previous note      Front office please notify patient: It takes 48-72 hours to process rx refill requests Ask patient to call pharmacy to ensure rx is ready before heading there.

## 2021-12-06 NOTE — Telephone Encounter (Signed)
Sent rx to pharmacy . Dr Neva Seat has stated in her lab result from 11/22/21 he wanted to start pt on this medication and she was agreeable to do so

## 2021-12-06 NOTE — Telephone Encounter (Signed)
Caller name: [place] (pt)  On DPR? :yes/no: Yes  Call back number: [phone]  Provider they see: Dr. Neva Seat  Reason for call: PT states that she was supposed to have a medication called in to her pharmacy for a cholesterol medication. Please send to [facility] on [address] [phone]

## 2021-12-30 ENCOUNTER — Ambulatory Visit (INDEPENDENT_AMBULATORY_CARE_PROVIDER_SITE_OTHER): Payer: BC Managed Care – PPO | Admitting: Family Medicine

## 2021-12-30 VITALS — BP 126/70 | HR 69 | Temp 98.0°F | Resp 16 | Ht 65.0 in | Wt 140.4 lb

## 2021-12-30 DIAGNOSIS — M1711 Unilateral primary osteoarthritis, right knee: Secondary | ICD-10-CM | POA: Diagnosis not present

## 2021-12-30 DIAGNOSIS — E785 Hyperlipidemia, unspecified: Secondary | ICD-10-CM | POA: Diagnosis not present

## 2021-12-30 DIAGNOSIS — M25561 Pain in right knee: Secondary | ICD-10-CM

## 2021-12-30 DIAGNOSIS — G8929 Other chronic pain: Secondary | ICD-10-CM

## 2021-12-30 LAB — LIPID PANEL
Cholesterol: 181 mg/dL (ref 0–200)
HDL: 70.5 mg/dL (ref >39.00)
LDL Cholesterol: 90 mg/dL (ref 0–99)
NonHDL: 110.41
Total CHOL/HDL Ratio: 3
Triglycerides: 101 mg/dL (ref 0.0–149.0)
VLDL: 20.2 mg/dL (ref 0.0–40.0)

## 2021-12-30 LAB — COMPREHENSIVE METABOLIC PANEL
ALT: 18 U/L (ref 0–35)
AST: 25 U/L (ref 0–37)
Albumin: 4.7 g/dL (ref 3.5–5.2)
Alkaline Phosphatase: 53 U/L (ref 39–117)
BUN: 19 mg/dL (ref 6–23)
CO2: 28 mEq/L (ref 19–32)
Calcium: 10.1 mg/dL (ref 8.4–10.5)
Chloride: 101 mEq/L (ref 96–112)
Creatinine, Ser: 0.69 mg/dL (ref 0.40–1.20)
GFR: 92.89 mL/min (ref >60.00)
Glucose, Bld: 100 mg/dL — ABNORMAL HIGH (ref 70–99)
Potassium: 4.5 mEq/L (ref 3.5–5.1)
Sodium: 140 mEq/L (ref 135–145)
Total Bilirubin: 0.9 mg/dL (ref 0.2–1.2)
Total Protein: 7.1 g/dL (ref 6.0–8.3)

## 2021-12-30 NOTE — Patient Instructions (Signed)
I will repeat calcium testing on your labs today but it was only borderline elevated last visit.  If it is still elevated I can check another labs to look at your parathyroid hormone.  No other medication changes for now.  Continue same dose of atorvastatin for now as I will check your cholesterol levels today.  Okay to continue turmeric, option of glucosamine/chondroitin supplement for knee arthritis, diclofenac topical as needed and continue bracing.  Let me know if you would like to meet with an orthopedist to decide on other treatments or possible injections. Let me know if there are questions and take care.

## 2021-12-30 NOTE — Progress Notes (Signed)
Subjective:  Patient ID: [name], female    DOB: 12/24/1960  Age: 61 y.o. MRN: [id number]  CC:  Chief Complaint  Patient presents with   Knee Injury    Rt knee reports about the same pain levels,     HPI Zareya Tuckett presents for   Right knee pain Discussed at her physical in July.  Intermittent swelling over the past few years.  She had reported a trip and fall about 10 years ago where she had struck her knee.  Treadmill was easier than outside walking due to the pain on the inside and behind her knee which had been experienced more often on the preceding month.  Treatment at that time with neoprene brace, occasional Advil, ice.  Pain at medial joint line, crepitus with McMurray but no specific pain.  Slight discomfort at terminal flexion.  Treated with topical diclofenac, x-ray indicatingDegenerative arthritis of the right knee with moderate medial compartment narrowing.  Tried otc diclofenac for 2 days - no difference. Not limiting activity but just sore.  Same pain on medial knee.  No locking/giving way. Tight after standing for prolonged periods.  No ortho eval.  Occasional ice. Neoprene brace feels better. Occasional advil.  Takes turmeric.   Hyperlipidemia: Started lipitor once per day past 5 weeks. No side effects or myalgias, taking coQ10.  Fasting today.  Lab Results  Component Value Date   CHOL 311 (H) 11/22/2021   HDL 75.30 11/22/2021   LDLCALC 211 (H) 11/22/2021   TRIG 125.0 11/22/2021   CHOLHDL 4 11/22/2021   Lab Results  Component Value Date   ALT 17 11/22/2021   AST 25 11/22/2021   ALKPHOS 49 11/22/2021   BILITOT 0.6 11/22/2021   Borderline hypercalcemia 10.7 on last labs.  Does take calcium and vitamin D supplements.  Plan for repeat testing today.     History There are no problems to display for this patient.  Past Medical History:  Diagnosis Date   Mitral valve prolapse    Seen by [facility]. this year, no test/no F/U required    Seasonal allergies    Past Surgical History:  Procedure Laterality Date   SVD     x 3   WISDOM TOOTH EXTRACTION     No Known Allergies Prior to Admission medications   Medication Sig Start Date End Date Taking? Authorizing Provider  atorvastatin (LIPITOR) 10 MG tablet Take 1 tablet (10 mg total) by mouth daily. 12/06/21  Yes Shade Flood, MD  Calcium Carb-Cholecalciferol (CALCIUM-VITAMIN D) 500-200 MG-UNIT tablet Take 1 tablet by mouth daily.   Yes [provider]  Cholecalciferol (VITAMIN D3) 125 MCG (5000 UT) CAPS Take by mouth.   Yes [provider]  CRANBERRY PO Take by mouth.   Yes [provider]  Cyanocobalamin (VITAMIN B-12 PO) Take 100 mcg by mouth.   Yes [provider]  Diclofenac Sodium 1 % CREA Apply 4 g topically 4 (four) times daily as needed. To knee 11/22/21  Yes Shade Flood, MD  estradiol (VIVELLE-DOT) 0.05 MG/24HR Place 1 patch onto the skin once a week. Sunday/Wednesday     Yes [provider]  Probiotic Product (PROBIOTIC PO) Take by mouth.   Yes [provider]  progesterone (PROMETRIUM) 100 MG capsule Take 100 mg by mouth daily.     Yes [provider]   Social History   Socioeconomic History   Marital status: Married    Spouse name: Not on file  Number of children: Not on file   Years of education: Not on file   Highest education level: Not on file  Occupational History   Not on file  Tobacco Use   Smoking status: Never   Smokeless tobacco: Never  Vaping Use   Vaping Use: Never used  Substance and Sexual Activity   Alcohol use: Yes    Alcohol/week: 3.0 standard drinks of alcohol    Types: 3 Glasses of wine per week    Comment: Max per month   Drug use: No   Sexual activity: Yes    Birth control/protection: Post-menopausal  Other Topics Concern   Not on file  Social History Narrative   Not on file   Social Determinants of Health   Financial Resource Strain: Not on  file  Food Insecurity: Not on file  Transportation Needs: Not on file  Physical Activity: Not on file  Stress: Not on file  Social Connections: Not on file  Intimate Partner Violence: Not on file    Review of Systems Per HPI.   Objective:   Vitals:   12/30/21 0946  BP: 126/70  Pulse: 69  Resp: 16  Temp: 98 F (36.7 C)  TempSrc: Oral  SpO2: 97%  Weight: 140 lb 6.4 oz (63.7 kg)  Height: 5\' 5"  (1.651 m)     Physical Exam Vitals reviewed.  Constitutional:      General: She is not in acute distress.    Appearance: Normal appearance. She is well-developed.  HENT:     Head: Normocephalic and atraumatic.  Cardiovascular:     Rate and Rhythm: Normal rate.  Pulmonary:     Effort: Pulmonary effort is normal.  Musculoskeletal:     Comments: Right knee, skin intact, no effusion, no erythema or ecchymosis.  No focal bony tenderness including medial joint line.  Full range of motion.  Slight crepitus with McMurray testing, no pain.  Ambulating normally without assistive device.  Neurological:     Mental Status: She is alert and oriented to person, place, and time.  Psychiatric:        Mood and Affect: Mood normal.        Assessment & Plan:  Melyna Huron is a 61 y.o. female . Chronic pain of right knee Osteoarthritis of right knee, unspecified osteoarthritis type  -Medial osteoarthritis, but minimal symptoms at this time.  Options discussed, deferred injection at this time.  Continue turmeric, can try glucosamine/chondroitin.  Topical diclofenac as needed, may need to use for more days to determine if effective.  Continue bracing as needed.  She will send a message on MyChart or call if she would like to meet with Ortho.  RTC precautions given.  Hyperlipidemia, unspecified hyperlipidemia type - Plan: Comprehensive metabolic panel, Lipid panel  -Check labs.  Tolerating current dose of Lipitor.  Likely will need higher dosing.  Continue co-Q10  supplementation.  Hypercalcemia - Plan: Comprehensive metabolic panel  -New problem, borderline on last labs.  Repeat calcium on CMP, then if elevated check PTH.  We will also need to review her calcium and vitamin D supplementation if still elevated.  No orders of the defined types were placed in this encounter.  Patient Instructions  I will repeat calcium testing on your labs today but it was only borderline elevated last visit.  If it is still elevated I can check another labs to look at your parathyroid hormone.  No other medication changes for now.  Continue same dose of atorvastatin for now  as I will check your cholesterol levels today.  Okay to continue turmeric, option of glucosamine/chondroitin supplement for knee arthritis, diclofenac topical as needed and continue bracing.  Let me know if you would like to meet with an orthopedist to decide on other treatments or possible injections. Let me know if there are questions and take care.     Signed,   Meredith Staggers, MD [facility], [facility] 12/30/21 10:29 AM

## 2022-03-31 DIAGNOSIS — Z6822 Body mass index (BMI) 22.0-22.9, adult: Secondary | ICD-10-CM | POA: Diagnosis not present

## 2022-03-31 DIAGNOSIS — Z01419 Encounter for gynecological examination (general) (routine) without abnormal findings: Secondary | ICD-10-CM | POA: Diagnosis not present

## 2022-03-31 DIAGNOSIS — Z1231 Encounter for screening mammogram for malignant neoplasm of breast: Secondary | ICD-10-CM | POA: Diagnosis not present

## 2022-06-04 DIAGNOSIS — N950 Postmenopausal bleeding: Secondary | ICD-10-CM | POA: Diagnosis not present

## 2022-06-16 DIAGNOSIS — N950 Postmenopausal bleeding: Secondary | ICD-10-CM | POA: Diagnosis not present

## 2022-08-06 ENCOUNTER — Ambulatory Visit (INDEPENDENT_AMBULATORY_CARE_PROVIDER_SITE_OTHER)
Admission: RE | Admit: 2022-08-06 | Discharge: 2022-08-06 | Disposition: A | Discharge status: Home / Self Care | Payer: BC Managed Care – PPO | Source: Ambulatory Visit | Attending: Family Medicine | Admitting: Family Medicine

## 2022-08-06 ENCOUNTER — Ambulatory Visit (INDEPENDENT_AMBULATORY_CARE_PROVIDER_SITE_OTHER): Payer: BC Managed Care – PPO | Admitting: Family Medicine

## 2022-08-06 VITALS — BP 128/82 | HR 99 | Temp 99.1°F | Ht 65.0 in | Wt 138.8 lb

## 2022-08-06 DIAGNOSIS — R0982 Postnasal drip: Secondary | ICD-10-CM | POA: Diagnosis not present

## 2022-08-06 DIAGNOSIS — R062 Wheezing: Secondary | ICD-10-CM

## 2022-08-06 DIAGNOSIS — R052 Subacute cough: Secondary | ICD-10-CM

## 2022-08-06 DIAGNOSIS — R059 Cough, unspecified: Secondary | ICD-10-CM | POA: Diagnosis not present

## 2022-08-06 MED ORDER — IPRATROPIUM BROMIDE 0.06 % NA SOLN: 1.0000 | Freq: Four times a day (QID) | NASAL | 5 refills | Status: DC

## 2022-08-06 MED ORDER — QVAR REDIHALER 40 MCG/ACT IN AERB: 1.0000 | INHALATION_SPRAY | Freq: Two times a day (BID) | RESPIRATORY_TRACT | 1 refills | Status: DC

## 2022-08-06 MED ORDER — FLUTICASONE PROPIONATE 50 MCG/ACT NA SUSP: 2.0000 | Freq: Every day | NASAL | 6 refills | Status: DC

## 2022-08-06 MED ORDER — BENZONATATE 100 MG PO CAPS: 100.0000 mg | ORAL_CAPSULE | Freq: Three times a day (TID) | ORAL | 0 refills | Status: DC | PRN

## 2022-08-06 NOTE — Progress Notes (Signed)
Subjective:  Patient ID: [name], female    DOB: 1960/11/14  Age: 62 y.o. MRN: [id number]  CC:  Chief Complaint  Patient presents with   Cough    Pt complains of cough that has been going on for a year but has noticed it has got worse over the last few weeks, keeping her up at night. Pt states cough is dry but has constant nasal drip and wheezing at night.     HPI [name] presents for   Cough: As above, past year. Slight annoying cough with laughing at times. worse past few months - more frequent, some during day. Worse past few weeks - coughing fits usually at night. Gagging at times during fit.  Dry cough, but feels like something in chest. Has had some chronic postnasal drip rhinorrhea at times. Clearing throat at night.  Tried decongestant last week - no change, taking claritin daily for allergies past 2 weeks. No nasal sprays.  No fever. No sick contacts.  No known history of asthma. Rattle/wheezy sound in chest - usually at night - daytime yesterday. No inhalers.  Not on ACE inhibitor.  No heartburn, no history of GERD.  No smoking, no secondhand smoke exposure.  No weight loss, night sweats.     History There are no problems to display for this patient.  Past Medical History:  Diagnosis Date   Mitral valve prolapse    Seen by [facility] [facility]. this year, no test/no F/U required   Seasonal allergies    Past Surgical History:  Procedure Laterality Date   SVD     x 3   WISDOM TOOTH EXTRACTION     No Known Allergies Prior to Admission medications   Medication Sig Start Date End Date Taking? Authorizing Provider  atorvastatin (LIPITOR) 10 MG tablet Take 1 tablet (10 mg total) by mouth daily. 12/06/21  Yes Wendie Agreste, MD  Calcium Carb-Cholecalciferol (CALCIUM-VITAMIN D) 500-200 MG-UNIT tablet Take 1 tablet by mouth daily.   Yes [provider]  Cholecalciferol (VITAMIN D3) 125 MCG (5000 UT) CAPS Take by mouth.   Yes [provider]  CRANBERRY PO Take by mouth.   Yes [provider]  Cyanocobalamin (VITAMIN B-12 PO) Take 100 mcg by mouth.   Yes [provider]  Diclofenac Sodium 1 % CREA Apply 4 g topically 4 (four) times daily as needed. To knee 11/22/21  Yes Wendie Agreste, MD  estradiol (VIVELLE-DOT) 0.05 MG/24HR Place 1 patch onto the skin once a week. Sunday/Wednesday     Yes [provider]  Probiotic Product (PROBIOTIC PO) Take by mouth.   Yes [provider]  progesterone (PROMETRIUM) 100 MG capsule Take 100 mg by mouth daily.     Yes [provider]   Social History   Socioeconomic History   Marital status: Married    Spouse name: Not on file   Number of children: Not on file   Years of education: Not on file   Highest education level: 12th grade  Occupational History   Not on file  Tobacco Use   Smoking status: Never   Smokeless tobacco: Never  Vaping Use   Vaping Use: Never used  Substance and Sexual Activity   Alcohol use: Yes    Alcohol/week: 3.0 standard drinks of alcohol    Types: 3 Glasses of wine per week    Comment: Max per month   Drug use: No   Sexual activity: Yes    Birth  control/protection: Post-menopausal  Other Topics Concern   Not on file  Social History Narrative   Not on file   Social Determinants of Health   Financial Resource Strain: Low Risk  (08/06/2022)   Overall Financial Resource Strain (CARDIA)    Difficulty of Paying Living Expenses: Not hard at all  Food Insecurity: No Food Insecurity (08/06/2022)   Hunger Vital Sign    Worried About Running Out of Food in the Last Year: Never true    Ran Out of Food in the Last Year: Never true  Transportation Needs: No Transportation Needs (08/06/2022)   PRAPARE - Hydrologist (Medical): No    Lack of Transportation (Non-Medical): No  Physical Activity: Sufficiently Active (08/06/2022)   Exercise Vital Sign    Days of Exercise per Week: 4 days     Minutes of Exercise per Session: 60 min  Stress: No Stress Concern Present (08/06/2022)   [place]    Feeling of Stress : Not at all  Social Connections: Moderately Integrated (08/06/2022)   Social Connection and Isolation Panel [NHANES]    Frequency of Communication with Friends and Family: Once a week    Frequency of Social Gatherings with Friends and Family: Once a week    Attends Religious Services: More than 4 times per year    Active Member of [employer] or Organizations: Yes    Attends Music therapist: More than 4 times per year    Marital Status: Married  Human resources officer Violence: Not on file    Review of Systems   Objective:   Vitals:   08/06/22 1018  BP: 128/82  Pulse: 99  Temp: 99.1 F (37.3 C)  TempSrc: Temporal  SpO2: 98%  Weight: 138 lb 12.8 oz (63 kg)  Height: 5\' 5"  (1.651 m)     Physical Exam Vitals reviewed.  Constitutional:      General: She is not in acute distress.    Appearance: Normal appearance. She is well-developed.  HENT:     Head: Normocephalic and atraumatic.     Right Ear: Hearing, tympanic membrane, ear canal and external ear normal.     Left Ear: Hearing, tympanic membrane, ear canal and external ear normal.     Nose: Congestion (minimal left nares.) present.     Comments: Sinuses nontender.     Mouth/Throat:     Pharynx: No posterior oropharyngeal erythema.  Eyes:     Conjunctiva/sclera: Conjunctivae normal.     Pupils: Pupils are equal, round, and reactive to light.  Neck:     Vascular: No carotid bruit.  Cardiovascular:     Rate and Rhythm: Normal rate and regular rhythm.     Heart sounds: Normal heart sounds. No murmur heard. Pulmonary:     Effort: Pulmonary effort is normal. No respiratory distress.     Breath sounds: Wheezing (faint end expiratory - left.) present. No rhonchi.  Abdominal:     Palpations: Abdomen is soft. There is no pulsatile mass.      Tenderness: There is no abdominal tenderness.  Musculoskeletal:     Right lower leg: No edema.     Left lower leg: No edema.  Skin:    General: Skin is warm and dry.     Findings: No rash.  Neurological:     Mental Status: She is alert and oriented to person, place, and time.  Psychiatric:        Mood  and Affect: Mood normal.        Behavior: Behavior normal.        Assessment & Plan:  [name] is a 62 y.o. female . Subacute cough - Plan: fluticasone (FLONASE) 50 MCG/ACT nasal spray, benzonatate (TESSALON) 100 MG capsule, beclomethasone (QVAR REDIHALER) 40 MCG/ACT inhaler, ipratropium (ATROVENT) 0.06 % nasal spray, DG Chest 2 View  PND (post-nasal drip) - Plan: fluticasone (FLONASE) 50 MCG/ACT nasal spray, ipratropium (ATROVENT) 0.06 % nasal spray  Wheeze - Plan: beclomethasone (QVAR REDIHALER) 40 MCG/ACT inhaler, DG Chest 2 View  Chronic cough with recent worsening.  Suspected postnasal drip as primary cause of upper airway cough syndrome.  Denies heartburn/reflux.  Cough has worsened, now with some wheeze, lower respiratory symptoms.  No fever, few faint wheezes on exam.  Doubt infectious.  Check chest x-ray given chronicity of cough and recent worsening.  Start Flonase nasal spray (correct technique discussed) in addition to her Claritin for allergies, Atrovent nasal spray if needed for congestion, Tessalon Perles as needed for cough.  Start Qvar for wheeze, recheck 3 weeks, RTC precautions given sooner if worsening.   Meds ordered this encounter  Medications   fluticasone (FLONASE) 50 MCG/ACT nasal spray    Sig: Place 2 sprays into both nostrils daily.    Dispense:  16 g    Refill:  6   benzonatate (TESSALON) 100 MG capsule    Sig: Take 1 capsule (100 mg total) by mouth 3 (three) times daily as needed for cough.    Dispense:  20 capsule    Refill:  0   beclomethasone (QVAR REDIHALER) 40 MCG/ACT inhaler    Sig: Inhale 1 puff into the lungs 2 (two) times daily.     Dispense:  1 each    Refill:  1   ipratropium (ATROVENT) 0.06 % nasal spray    Sig: Place 1-2 sprays into both nostrils 4 (four) times daily. As needed for nasal congestion    Dispense:  15 mL    Refill:  5   Patient Instructions  As we discussed I suspect some of your cough is from postnasal drip, but I do hear a slight wheeze as well.  Postnasal drip can sometimes be from allergies.  As Claritin ineffective, add Flonase daily.  If that is not helping with congestion or drainage, can add the ipratropium nasal spray as needed.  Tessalon Perles as needed for cough.  Please have x-ray performed at location below.  I have also started an inhaler to use twice per day for the wheeze.  Make sure to rinse out your mouth after using the inhaler to lessen risk of thrush.  Recheck in the next few weeks and can decide if other treatment or testing needed at that time.  Take care!  Return to the clinic or go to the nearest emergency room if any of your symptoms worsen or new symptoms occur.  [facility] or xray: Walk in 8:30-4:30 during weekdays, no appointment needed [address].  [place], [place] [id number]   Cough, Adult Coughing is a reflex that clears your throat and airways (respiratory system). It helps heal and protect your lungs. It is normal to cough from time to time. A cough that happens with other symptoms or that lasts a long time may be a sign of a condition that needs treatment. A short-term (acute) cough may only last 2-3 weeks. A long-term (chronic) cough may last 8 or more weeks. Coughing is often caused by:  Diseases, such as: An infection of the respiratory system. Asthma or other heart or lung diseases. Gastroesophageal reflux. This is when acid comes back up from the stomach. Breathing in things that irritate your lungs. Allergies. Postnasal drip. This is when mucus runs down the back of your throat. Smoking. Some medicines. Follow these instructions at  home: Medicines Take over-the-counter and prescription medicines only as told by your health care provider. Talk with your provider before you take cough medicine (cough suppressants). Eating and drinking Do not drink alcohol. Avoid caffeine. Drink enough fluid to keep your pee (urine) pale yellow. Lifestyle Avoid cigarette smoke. Do not use any products that contain nicotine or tobacco. These products include cigarettes, chewing tobacco, and vaping devices, such as e-cigarettes. If you need help quitting, ask your provider. Avoid things that make you cough. These may include perfumes, candles, cleaning products, or campfire smoke. General instructions  Watch for any changes to your cough. Tell your provider about them. Always cover your mouth when you cough. If the air is dry in your bedroom or home, use a cool mist vaporizer or humidifier. If your cough is worse at night, try to sleep in a semi-upright position. Rest as needed. Contact a health care provider if: You have new symptoms, or your symptoms get worse. You cough up pus. You have a fever that does not go away or a cough that does not get better after 2-3 weeks. You cannot control your cough with medicine, and you are losing sleep. You have pain that gets worse or is not helped with medicine. You lose weight for no clear reason. You have night sweats. Get help right away if: You cough up blood. You have trouble breathing. Your heart is beating very fast. These symptoms may be an emergency. Get help right away. Call 911. Do not wait to see if the symptoms will go away. Do not drive yourself to the hospital. This information is not intended to replace advice given to you by your health care provider. Make sure you discuss any questions you have with your health care provider. Document Revised: 12/27/2021 Document Reviewed: 12/27/2021 Elsevier Patient Education  [place],   Merri Ray,  MD [facility], [facility] 08/06/22 10:52 AM

## 2022-08-06 NOTE — Patient Instructions (Signed)
As we discussed I suspect some of your cough is from postnasal drip, but I do hear a slight wheeze as well.  Postnasal drip can sometimes be from allergies.  As Claritin ineffective, add Flonase daily.  If that is not helping with congestion or drainage, can add the ipratropium nasal spray as needed.  Tessalon Perles as needed for cough.  Please have x-ray performed at location below.  I have also started an inhaler to use twice per day for the wheeze.  Make sure to rinse out your mouth after using the inhaler to lessen risk of thrush.  Recheck in the next few weeks and can decide if other treatment or testing needed at that time.  Take care!  Return to the clinic or go to the nearest emergency room if any of your symptoms worsen or new symptoms occur.  [facility] or xray: Walk in 8:30-4:30 during weekdays, no appointment needed [address].  [place], [place] [id number]   Cough, Adult Coughing is a reflex that clears your throat and airways (respiratory system). It helps heal and protect your lungs. It is normal to cough from time to time. A cough that happens with other symptoms or that lasts a long time may be a sign of a condition that needs treatment. A short-term (acute) cough may only last 2-3 weeks. A long-term (chronic) cough may last 8 or more weeks. Coughing is often caused by: Diseases, such as: An infection of the respiratory system. Asthma or other heart or lung diseases. Gastroesophageal reflux. This is when acid comes back up from the stomach. Breathing in things that irritate your lungs. Allergies. Postnasal drip. This is when mucus runs down the back of your throat. Smoking. Some medicines. Follow these instructions at home: Medicines Take over-the-counter and prescription medicines only as told by your health care provider. Talk with your provider before you take cough medicine (cough suppressants). Eating and drinking Do not drink alcohol. Avoid caffeine. Drink  enough fluid to keep your pee (urine) pale yellow. Lifestyle Avoid cigarette smoke. Do not use any products that contain nicotine or tobacco. These products include cigarettes, chewing tobacco, and vaping devices, such as e-cigarettes. If you need help quitting, ask your provider. Avoid things that make you cough. These may include perfumes, candles, cleaning products, or campfire smoke. General instructions  Watch for any changes to your cough. Tell your provider about them. Always cover your mouth when you cough. If the air is dry in your bedroom or home, use a cool mist vaporizer or humidifier. If your cough is worse at night, try to sleep in a semi-upright position. Rest as needed. Contact a health care provider if: You have new symptoms, or your symptoms get worse. You cough up pus. You have a fever that does not go away or a cough that does not get better after 2-3 weeks. You cannot control your cough with medicine, and you are losing sleep. You have pain that gets worse or is not helped with medicine. You lose weight for no clear reason. You have night sweats. Get help right away if: You cough up blood. You have trouble breathing. Your heart is beating very fast. These symptoms may be an emergency. Get help right away. Call 911. Do not wait to see if the symptoms will go away. Do not drive yourself to the hospital. This information is not intended to replace advice given to you by your health care provider. Make sure you discuss any questions  you have with your health care provider. Document Revised: 12/27/2021 Document Reviewed: 12/27/2021 Elsevier Patient Education  [place].

## 2022-08-11 ENCOUNTER — Ambulatory Visit: Payer: BC Managed Care – PPO | Admitting: Family Medicine

## 2022-08-27 ENCOUNTER — Ambulatory Visit (INDEPENDENT_AMBULATORY_CARE_PROVIDER_SITE_OTHER): Payer: BC Managed Care – PPO | Admitting: Family Medicine

## 2022-08-27 ENCOUNTER — Encounter: Payer: Self-pay | Admitting: Family Medicine

## 2022-08-27 VITALS — BP 118/66 | HR 80 | Temp 97.9°F | Ht 65.0 in | Wt 137.0 lb

## 2022-08-27 DIAGNOSIS — R059 Cough, unspecified: Secondary | ICD-10-CM

## 2022-08-27 DIAGNOSIS — R0982 Postnasal drip: Secondary | ICD-10-CM | POA: Diagnosis not present

## 2022-08-27 NOTE — Patient Instructions (Signed)
Glad to hear the cough is improved.  Postnasal drip is still likely the main cause.  Okay to take Claritin, but during allergy season you may notice some benefit with the fluticasone nasal spray consistently 1 to 2 sprays in each nostril per day.  I would start with 1 spray per nostril to see if that is effective at the lowest dose.  That may take a week or 2 to see benefit.  If you start to have wheeze again, I would recommend starting the beclomethasone inhaler and give that a week or 2 to see if that is effective.  Albuterol is also an option for more rapid relief of wheezing, let me know if that occurs. Thanks for coming in today and let me know if there are questions.  Postnasal Drip Postnasal drip is the feeling of mucus going down the back of your throat. Mucus is a slimy substance that moistens and cleans your nose and throat, as well as the air pockets in face bones near your forehead and cheeks (sinuses). Small amounts of mucus pass from your nose and sinuses down the back of your throat all the time. This is normal. When you produce too much mucus or the mucus gets too thick, you can feel it. Some common causes of postnasal drip include: Having more mucus because of: A cold or the flu. Allergies. Cold air. Certain medicines. Gastroesophageal reflux. Having more mucus that is thicker because of: A sinus or nasal infection. Dry air. A food allergy. Follow these instructions at home: Relieving discomfort  Gargle with a mixture of salt and water 3-4 times a day or as needed. To make salt water, completely dissolve -1 tsp (3-6 g) of salt in 1 cup (237 mL) of warm water. If the air in your home is dry, use a humidifier to add moisture to the air. Use a saline spray or a container (neti pot) to flush out the nose (nasal irrigation). These methods can help clear away mucus and keep the nasal passages moist. General instructions Take over-the-counter and prescription medicines only as told  by your health care provider. Follow instructions from your health care provider about eating or drinking restrictions. You may need to avoid caffeine. Avoid things that you know you are allergic to (allergens), like dust, mold, pollen, pets, or certain foods. Drink enough fluid to keep your urine pale yellow. Keep all follow-up visits. This is important. Contact a health care provider if: You have a fever. You have a sore throat or difficulty swallowing. You have a headache. You have sinus or ear pain. You have a cough that does not go away. The mucus from your nose becomes thick and is green or yellow in color. You have cold or flu symptoms that last more than 10 days. Summary Postnasal drip is the feeling of mucus going down the back of your throat. Use nasal irrigation or a nasal spray to help clear away mucus and keep the nasal passages moist. Avoid things that you know you are allergic to (allergens), like dust, mold, pollen, pets, or certain foods. This information is not intended to replace advice given to you by your health care provider. Make sure you discuss any questions you have with your health care provider. Document Revised: 03/28/2021 Document Reviewed: 03/28/2021 Elsevier Patient Education  2023 [employer].

## 2022-08-27 NOTE — Progress Notes (Signed)
Subjective:  Patient ID: [name], female    DOB: 1961-03-20  Age: 62 y.o. MRN: [id number]  CC:  Chief Complaint  Patient presents with   Cough    Pt notes cough is significantly improved no other concerns     HPI Kenneshia Rehm presents for   Follow-up cough Discussed in March, past year of cough.  Usually at night.  Dry cough.  Some chronic postnasal drip and clearing throat.  No change with prior decongestant trial and was taking Claritin daily.  Suspected postnasal drip as primary cause of upper airway cough syndrome, denied heartburn/reflux.  Did have some worsening cough with lower respiratory symptoms and wheeze.  Chest x-ray ordered without concerns, started on Flonase with correct technique discussed in addition to Claritin, Atrovent nasal spray if needed Tessalon Perles and Qvar for wheeze, possible cough variant asthma.  Since last visit she has improved. Tried once of the nasal sprays for 1 week. Tried steroid inhaler a few times, but minimal change so stopped.  Tessalon for 1 day - minimal change.  Googled what to do to help cough. Drank apple cider vinegar tablespoon in water daily and humidifier at night with HOB elevated, and air purifier.  Still some postnasal drip. No wheeze/dyspnea. No fever.   History There are no problems to display for this patient.  Past Medical History:  Diagnosis Date   Mitral valve prolapse    Seen by [facility]. this year, no test/no F/U required   Seasonal allergies    Past Surgical History:  Procedure Laterality Date   SVD     x 3   WISDOM TOOTH EXTRACTION     No Known Allergies Prior to Admission medications   Medication Sig Start Date End Date Taking? Authorizing Provider  atorvastatin (LIPITOR) 10 MG tablet Take 1 tablet (10 mg total) by mouth daily. 12/06/21  Yes Shade Flood, MD  beclomethasone (QVAR REDIHALER) 40 MCG/ACT inhaler Inhale 1 puff into the lungs 2 (two) times daily. 08/06/22  Yes Shade Flood, MD  benzonatate (TESSALON) 100 MG capsule Take 1 capsule (100 mg total) by mouth 3 (three) times daily as needed for cough. 08/06/22  Yes Shade Flood, MD  Calcium Carb-Cholecalciferol (CALCIUM-VITAMIN D) 500-200 MG-UNIT tablet Take 1 tablet by mouth daily.   Yes [provider]  Cholecalciferol (VITAMIN D3) 125 MCG (5000 UT) CAPS Take by mouth.   Yes [provider]  CRANBERRY PO Take by mouth.   Yes [provider]  Cyanocobalamin (VITAMIN B-12 PO) Take 100 mcg by mouth.   Yes [provider]  Diclofenac Sodium 1 % CREA Apply 4 g topically 4 (four) times daily as needed. To knee 11/22/21  Yes Shade Flood, MD  estradiol (VIVELLE-DOT) 0.05 MG/24HR Place 1 patch onto the skin once a week. Sunday/Wednesday     Yes [provider]  fluticasone (FLONASE) 50 MCG/ACT nasal spray Place 2 sprays into both nostrils daily. 08/06/22  Yes Shade Flood, MD  ipratropium (ATROVENT) 0.06 % nasal spray Place 1-2 sprays into both nostrils 4 (four) times daily. As needed for nasal congestion 08/06/22  Yes Shade Flood, MD  Probiotic Product (PROBIOTIC PO) Take by mouth.   Yes [provider]  progesterone (PROMETRIUM) 100 MG capsule Take 100 mg by mouth daily.     Yes [provider]   Social History   Socioeconomic History   Marital status: Married    Spouse name: Not on file  Number of children: Not on file   Years of education: Not on file   Highest education level: 12th grade  Occupational History   Not on file  Tobacco Use   Smoking status: Never   Smokeless tobacco: Never  Vaping Use   Vaping Use: Never used  Substance and Sexual Activity   Alcohol use: Yes    Alcohol/week: 3.0 standard drinks of alcohol    Types: 3 Glasses of wine per week    Comment: Max per month   Drug use: No   Sexual activity: Yes    Birth control/protection: Post-menopausal  Other Topics Concern   Not on file  Social History Narrative    Not on file   Social Determinants of Health   Financial Resource Strain: Low Risk  (08/06/2022)   Overall Financial Resource Strain (CARDIA)    Difficulty of Paying Living Expenses: Not hard at all  Food Insecurity: No Food Insecurity (08/06/2022)   Hunger Vital Sign    Worried About Running Out of Food in the Last Year: Never true    Ran Out of Food in the Last Year: Never true  Transportation Needs: No Transportation Needs (08/06/2022)   PRAPARE - Administrator, Civil Service (Medical): No    Lack of Transportation (Non-Medical): No  Physical Activity: Sufficiently Active (08/06/2022)   Exercise Vital Sign    Days of Exercise per Week: 4 days    Minutes of Exercise per Session: 60 min  Stress: No Stress Concern Present (08/06/2022)   [employer] of Occupational Health - Occupational Stress Questionnaire    Feeling of Stress : Not at all  Social Connections: Moderately Integrated (08/06/2022)   Social Connection and Isolation Panel [NHANES]    Frequency of Communication with Friends and Family: Once a week    Frequency of Social Gatherings with Friends and Family: Once a week    Attends Religious Services: More than 4 times per year    Active Member of [employer] or Organizations: Yes    Attends Engineer, structural: More than 4 times per year    Marital Status: Married  Catering manager Violence: Not on file    Review of Systems 13 point review of systems per patient health survey noted.  Negative other than as indicated above or in HPI.    Objective:   Vitals:   08/27/22 1306  BP: 118/66  Pulse: 80  Temp: 97.9 F (36.6 C)  TempSrc: Temporal  SpO2: 98%  Weight: 137 lb (62.1 kg)  Height: 5\' 5"  (1.651 m)     Physical Exam Vitals reviewed.  Constitutional:      General: She is not in acute distress.    Appearance: She is well-developed.  HENT:     Head: Normocephalic and atraumatic.     Right Ear: Hearing, tympanic membrane, ear canal and  external ear normal.     Left Ear: Hearing, tympanic membrane, ear canal and external ear normal.     Nose: Congestion present.     Comments: Edematous right greater than left turbinates.  No active discharge.    Mouth/Throat:     Pharynx: No posterior oropharyngeal erythema.  Eyes:     Conjunctiva/sclera: Conjunctivae normal.     Pupils: Pupils are equal, round, and reactive to light.  Cardiovascular:     Rate and Rhythm: Normal rate and regular rhythm.     Heart sounds: Normal heart sounds. No murmur heard. Pulmonary:     Effort: Pulmonary  effort is normal. No respiratory distress.     Breath sounds: Normal breath sounds. No wheezing or rhonchi.  Skin:    General: Skin is warm and dry.     Findings: No rash.  Neurological:     Mental Status: She is alert and oriented to person, place, and time.  Psychiatric:        Mood and Affect: Mood normal.        Behavior: Behavior normal.        Assessment & Plan:  Milliana Reddoch is a 62 y.o. female . Postnasal drip  Cough, unspecified type Suspected postnasal drip as primary cause.  Cough has significantly improved.  Continue Claritin for allergies, option to restart Flonase with timing of potential benefit discussed.  Option to restart beclomethasone if more lower respiratory symptoms or wheeze but clear at this time.  RTC precautions given.   No orders of the defined types were placed in this encounter.  Patient Instructions  Glad to hear the cough is improved.  Postnasal drip is still likely the main cause.  Okay to take Claritin, but during allergy season you may notice some benefit with the fluticasone nasal spray consistently 1 to 2 sprays in each nostril per day.  I would start with 1 spray per nostril to see if that is effective at the lowest dose.  That may take a week or 2 to see benefit.  If you start to have wheeze again, I would recommend starting the beclomethasone inhaler and give that a week or 2 to see if that is  effective.  Albuterol is also an option for more rapid relief of wheezing, let me know if that occurs. Thanks for coming in today and let me know if there are questions.  Postnasal Drip Postnasal drip is the feeling of mucus going down the back of your throat. Mucus is a slimy substance that moistens and cleans your nose and throat, as well as the air pockets in face bones near your forehead and cheeks (sinuses). Small amounts of mucus pass from your nose and sinuses down the back of your throat all the time. This is normal. When you produce too much mucus or the mucus gets too thick, you can feel it. Some common causes of postnasal drip include: Having more mucus because of: A cold or the flu. Allergies. Cold air. Certain medicines. Gastroesophageal reflux. Having more mucus that is thicker because of: A sinus or nasal infection. Dry air. A food allergy. Follow these instructions at home: Relieving discomfort  Gargle with a mixture of salt and water 3-4 times a day or as needed. To make salt water, completely dissolve -1 tsp (3-6 g) of salt in 1 cup (237 mL) of warm water. If the air in your home is dry, use a humidifier to add moisture to the air. Use a saline spray or a container (neti pot) to flush out the nose (nasal irrigation). These methods can help clear away mucus and keep the nasal passages moist. General instructions Take over-the-counter and prescription medicines only as told by your health care provider. Follow instructions from your health care provider about eating or drinking restrictions. You may need to avoid caffeine. Avoid things that you know you are allergic to (allergens), like dust, mold, pollen, pets, or certain foods. Drink enough fluid to keep your urine pale yellow. Keep all follow-up visits. This is important. Contact a health care provider if: You have a fever. You have a sore throat or  difficulty swallowing. You have a headache. You have sinus or ear  pain. You have a cough that does not go away. The mucus from your nose becomes thick and is green or yellow in color. You have cold or flu symptoms that last more than 10 days. Summary Postnasal drip is the feeling of mucus going down the back of your throat. Use nasal irrigation or a nasal spray to help clear away mucus and keep the nasal passages moist. Avoid things that you know you are allergic to (allergens), like dust, mold, pollen, pets, or certain foods. This information is not intended to replace advice given to you by your health care provider. Make sure you discuss any questions you have with your health care provider. Document Revised: 03/28/2021 Document Reviewed: 03/28/2021 Elsevier Patient Education  2023 Elsevier Inc.     Signed,   Meredith Staggers, MD [facility], [facility] 08/27/22 1:49 PM

## 2022-09-15 DIAGNOSIS — N950 Postmenopausal bleeding: Secondary | ICD-10-CM | POA: Diagnosis not present

## 2022-09-15 DIAGNOSIS — N8501 Benign endometrial hyperplasia: Secondary | ICD-10-CM | POA: Diagnosis not present

## 2022-09-15 DIAGNOSIS — N840 Polyp of corpus uteri: Secondary | ICD-10-CM | POA: Diagnosis not present

## 2022-10-13 DIAGNOSIS — N950 Postmenopausal bleeding: Secondary | ICD-10-CM | POA: Diagnosis not present

## 2022-11-27 ENCOUNTER — Encounter: Payer: Self-pay | Admitting: Family Medicine

## 2022-11-27 ENCOUNTER — Ambulatory Visit (INDEPENDENT_AMBULATORY_CARE_PROVIDER_SITE_OTHER): Payer: BC Managed Care – PPO | Admitting: Family Medicine

## 2022-11-27 VITALS — BP 126/60 | HR 96 | Temp 97.8°F | Ht 65.0 in | Wt 136.4 lb

## 2022-11-27 DIAGNOSIS — R2240 Localized swelling, mass and lump, unspecified lower limb: Secondary | ICD-10-CM | POA: Diagnosis not present

## 2022-11-27 DIAGNOSIS — R739 Hyperglycemia, unspecified: Secondary | ICD-10-CM

## 2022-11-27 DIAGNOSIS — E785 Hyperlipidemia, unspecified: Secondary | ICD-10-CM

## 2022-11-27 DIAGNOSIS — Z0000 Encounter for general adult medical examination without abnormal findings: Secondary | ICD-10-CM

## 2022-11-27 LAB — CBC WITH DIFFERENTIAL/PLATELET
Basophils Absolute: 0.1 10*3/uL (ref 0.0–0.1)
Basophils Relative: 1 % (ref 0.0–3.0)
Eosinophils Absolute: 0.4 10*3/uL (ref 0.0–0.7)
Eosinophils Relative: 4.1 % (ref 0.0–5.0)
HCT: 46.3 % — ABNORMAL HIGH (ref 36.0–46.0)
Hemoglobin: 15.3 g/dL — ABNORMAL HIGH (ref 12.0–15.0)
Lymphocytes Relative: 23.5 % (ref 12.0–46.0)
Lymphs Abs: 2.1 10*3/uL (ref 0.7–4.0)
MCHC: 33 g/dL (ref 30.0–36.0)
MCV: 94.9 fl (ref 78.0–100.0)
Monocytes Absolute: 0.8 10*3/uL (ref 0.1–1.0)
Monocytes Relative: 9.5 % (ref 3.0–12.0)
Neutro Abs: 5.4 10*3/uL (ref 1.4–7.7)
Neutrophils Relative %: 61.9 % (ref 43.0–77.0)
Platelets: 265 10*3/uL (ref 150.0–400.0)
RBC: 4.88 Mil/uL (ref 3.87–5.11)
RDW: 13 % (ref 11.5–15.5)
WBC: 8.8 10*3/uL (ref 4.0–10.5)

## 2022-11-27 LAB — LIPID PANEL
Cholesterol: 214 mg/dL — ABNORMAL HIGH (ref 0–200)
HDL: 71.2 mg/dL (ref >39.00)
LDL Cholesterol: 125 mg/dL — ABNORMAL HIGH (ref 0–99)
NonHDL: 142.42
Total CHOL/HDL Ratio: 3
Triglycerides: 89 mg/dL (ref 0.0–149.0)
VLDL: 17.8 mg/dL (ref 0.0–40.0)

## 2022-11-27 LAB — COMPREHENSIVE METABOLIC PANEL
ALT: 20 U/L (ref 0–35)
AST: 25 U/L (ref 0–37)
Albumin: 4.8 g/dL (ref 3.5–5.2)
Alkaline Phosphatase: 52 U/L (ref 39–117)
BUN: 24 mg/dL — ABNORMAL HIGH (ref 6–23)
CO2: 30 mEq/L (ref 19–32)
Calcium: 10.9 mg/dL — ABNORMAL HIGH (ref 8.4–10.5)
Chloride: 101 mEq/L (ref 96–112)
Creatinine, Ser: 0.67 mg/dL (ref 0.40–1.20)
GFR: 92.95 mL/min (ref >60.00)
Glucose, Bld: 86 mg/dL (ref 70–99)
Potassium: 4.4 mEq/L (ref 3.5–5.1)
Sodium: 139 mEq/L (ref 135–145)
Total Bilirubin: 0.8 mg/dL (ref 0.2–1.2)
Total Protein: 7.3 g/dL (ref 6.0–8.3)

## 2022-11-27 LAB — HEMOGLOBIN A1C: Hgb A1c MFr Bld: 5.8 % (ref 4.6–6.5)

## 2022-11-27 LAB — TSH: TSH: 1.49 u[IU]/mL (ref 0.35–5.50)

## 2022-11-27 NOTE — Patient Instructions (Signed)
I will refer you to podiatry to evaluate nodule/bump.  If any concerns on labs I will let you know but no medication changes for now.  Schedule appointment with eye provider when possible.  Let me know if there are any questions and take care.  Preventive Care 54-63 Years Old, Female Preventive care refers to lifestyle choices and visits with your health care provider that can promote health and wellness. Preventive care visits are also called wellness exams. What can I expect for my preventive care visit? Counseling Your health care provider may ask you questions about your: Medical history, including: Past medical problems. Family medical history. Pregnancy history. Current health, including: Menstrual cycle. Method of birth control. Emotional well-being. Home life and relationship well-being. Sexual activity and sexual health. Lifestyle, including: Alcohol, nicotine or tobacco, and drug use. Access to firearms. Diet, exercise, and sleep habits. Work and work Astronomer. Sunscreen use. Safety issues such as seatbelt and bike helmet use. Physical exam Your health care provider will check your: Height and weight. These may be used to calculate your BMI (body mass index). BMI is a measurement that tells if you are at a healthy weight. Waist circumference. This measures the distance around your waistline. This measurement also tells if you are at a healthy weight and may help predict your risk of certain diseases, such as type 2 diabetes and high blood pressure. Heart rate and blood pressure. Body temperature. Skin for abnormal spots. What immunizations do I need?  Vaccines are usually given at various ages, according to a schedule. Your health care provider will recommend vaccines for you based on your age, medical history, and lifestyle or other factors, such as travel or where you work. What tests do I need? Screening Your health care provider may recommend screening tests for  certain conditions. This may include: Lipid and cholesterol levels. Diabetes screening. This is done by checking your blood sugar (glucose) after you have not eaten for a while (fasting). Pelvic exam and Pap test. Hepatitis B test. Hepatitis C test. HIV (human immunodeficiency virus) test. STI (sexually transmitted infection) testing, if you are at risk. Lung cancer screening. Colorectal cancer screening. Mammogram. Talk with your health care provider about when you should start having regular mammograms. This may depend on whether you have a family history of breast cancer. BRCA-related cancer screening. This may be done if you have a family history of breast, ovarian, tubal, or peritoneal cancers. Bone density scan. This is done to screen for osteoporosis. Talk with your health care provider about your test results, treatment options, and if necessary, the need for more tests. Follow these instructions at home: Eating and drinking  Eat a diet that includes fresh fruits and vegetables, whole grains, lean protein, and low-fat dairy products. Take vitamin and mineral supplements as recommended by your health care provider. Do not drink alcohol if: Your health care provider tells you not to drink. You are pregnant, may be pregnant, or are planning to become pregnant. If you drink alcohol: Limit how much you have to 0-1 drink a day. Know how much alcohol is in your drink. In the [place] one drink equals one 12 oz bottle of beer (355 mL), one 5 oz glass of wine (148 mL), or one 1 oz glass of hard liquor (44 mL). Lifestyle Brush your teeth every morning and night with fluoride toothpaste. Floss one time each day. Exercise for at least 30 minutes 5 or more days each week. Do not use any products that  contain nicotine or tobacco. These products include cigarettes, chewing tobacco, and vaping devices, such as e-cigarettes. If you need help quitting, ask your health care provider. Do not use  drugs. If you are sexually active, practice safe sex. Use a condom or other form of protection to prevent STIs. If you do not wish to become pregnant, use a form of birth control. If you plan to become pregnant, see your health care provider for a prepregnancy visit. Take aspirin only as told by your health care provider. Make sure that you understand how much to take and what form to take. Work with your health care provider to find out whether it is safe and beneficial for you to take aspirin daily. Find healthy ways to manage stress, such as: Meditation, yoga, or listening to music. Journaling. Talking to a trusted person. Spending time with friends and family. Minimize exposure to UV radiation to reduce your risk of skin cancer. Safety Always wear your seat belt while driving or riding in a vehicle. Do not drive: If you have been drinking alcohol. Do not ride with someone who has been drinking. When you are tired or distracted. While texting. If you have been using any mind-altering substances or drugs. Wear a helmet and other protective equipment during sports activities. If you have firearms in your house, make sure you follow all gun safety procedures. Seek help if you have been physically or sexually abused. What's next? Visit your health care provider once a year for an annual wellness visit. Ask your health care provider how often you should have your eyes and teeth checked. Stay up to date on all vaccines. This information is not intended to replace advice given to you by your health care provider. Make sure you discuss any questions you have with your health care provider. Document Revised: 10/24/2020 Document Reviewed: 10/24/2020 Elsevier Patient Education  2024 [employer].

## 2022-11-27 NOTE — Progress Notes (Signed)
Subjective:  Patient ID: [name], female    DOB: 1960-07-06  Age: 62 y.o. MRN: [id number]  CC:  Chief Complaint  Patient presents with   Annual Exam    Pt doing well, fasting     HPI [name] presents for Annual Exam  PCP, me Cardiology Dr. Elease Hashimoto with history of mitral valve prolapse. No recent visit.  Gynecology, Dr. Billy Coast  Seen in March, April for cough, suspected component of postnasal drip, underlying allergies, and possible reactive airway.  Improved, continued on Claritin, option to restart Flonase and beclomethasone inhaler if needed for lower respiratory symptoms or wheezing.  Atrovent nasal spray as needed. Occasional allergies past month - has not yet started back on nasal sprays.   Prediabetes: Diet/exercise approach.  Weight stable.  Avoiding soda, sugar beverages. Rare fast food.  Exercise - walking on treadmill. Incline. 5 days per week, 4 hours total.  Lab Results  Component Value Date   HGBA1C 5.9 11/22/2021   Wt Readings from Last 3 Encounters:  11/27/22 136 lb 6.4 oz (61.9 kg)  08/27/22 137 lb (62.1 kg)  08/06/22 138 lb 12.8 oz (63 kg)    Hyperlipidemia: Lipitor 10 mg daily. Intolerant to Crestor in the past.  Coronary calcium score in 2019 with calcified plaque in the mid LAD but no significant stenosis.  Family history of cardiac issues in her father in late 62s and 53s.  Intermittent statin as option.  Stable control last year, no new side effects or myalgias. Lab Results  Component Value Date   CHOL 181 12/30/2021   HDL 70.50 12/30/2021   LDLCALC 90 12/30/2021   TRIG 101.0 12/30/2021   CHOLHDL 3 12/30/2021   Lab Results  Component Value Date   ALT 18 12/30/2021   AST 25 12/30/2021   ALKPHOS 53 12/30/2021   BILITOT 0.9 12/30/2021         11/27/2022   11:00 AM 08/06/2022   10:13 AM 12/30/2021    9:48 AM 11/08/2020    3:22 PM  Depression screen PHQ 2/9  Decreased Interest 0 0 0 0  Down, Depressed, Hopeless 0 0 0 0  PHQ - 2  Score 0 0 0 0  Altered sleeping 0 0    Tired, decreased energy 0 0    Change in appetite 0 0    Feeling bad or failure about yourself  0 0    Trouble concentrating 0 0    Moving slowly or fidgety/restless 0 0    Suicidal thoughts 0 0    PHQ-9 Score 0 0    Difficult doing work/chores  Not difficult at all      Health Maintenance  Topic Date Due   MAMMOGRAM  05/12/2021   COVID-19 Vaccine (4 - 2023-24 season) 01/10/2022   PAP SMEAR-Modifier  05/12/2022   INFLUENZA VACCINE  12/11/2022   DTaP/Tdap/Td (2 - Td or Tdap) 11/09/2030   Colonoscopy  03/20/2031   Hepatitis C Screening  Completed   HIV Screening  Completed   Zoster Vaccines- Shingrix  Completed   HPV VACCINES  Aged Out  Negative cologuard 03/19/21, repeat in 2025.  Mammogram at GYN - in past year. Reports normal - annual exam.  Followed by GYN for pap testing. In past 2 years. Uterine polyp removed few months ago.   Immunization History  Administered Date(s) Administered   PFIZER(Purple Top)SARS-COV-2 Vaccination 08/04/2019, 08/25/2019, 05/07/2020   Tdap 11/08/2020   Zoster Recombinant(Shingrix) 06/07/2020, 08/02/2020  RSV vaccine - declined.  Flu  vaccine each fall.  Covid booster - declines further vaccines.   No results found. Glasses and contacts. Due for optho -eval - plans to schedule.   Dental: every year. No dental changes.   Alcohol: rare - less than 1 per month.   Tobacco: none  Exercise: as above.   Bump on left great toe past year. No pain. Sore with certain shoes. No injury.    History There are no problems to display for this patient.  Past Medical History:  Diagnosis Date   Mitral valve prolapse    Seen by [facility]. this year, no test/no F/U required   Seasonal allergies    Past Surgical History:  Procedure Laterality Date   SVD     x 3   WISDOM TOOTH EXTRACTION     No Known Allergies Prior to Admission medications   Medication Sig Start Date End Date Taking? Authorizing  Provider  atorvastatin (LIPITOR) 10 MG tablet Take 1 tablet (10 mg total) by mouth daily. 12/06/21  Yes Shade Flood, MD  Calcium Carb-Cholecalciferol (CALCIUM-VITAMIN D) 500-200 MG-UNIT tablet Take 1 tablet by mouth daily.   Yes [provider]  Cholecalciferol (VITAMIN D3) 125 MCG (5000 UT) CAPS Take by mouth.   Yes [provider]  CRANBERRY PO Take by mouth.   Yes [provider]  estradiol (VIVELLE-DOT) 0.05 MG/24HR Place 1 patch onto the skin once a week. Sunday/Wednesday     Yes [provider]  Probiotic Product (PROBIOTIC PO) Take by mouth.   Yes [provider]  progesterone (PROMETRIUM) 100 MG capsule Take 100 mg by mouth daily.     Yes [provider]  beclomethasone (QVAR REDIHALER) 40 MCG/ACT inhaler Inhale 1 puff into the lungs 2 (two) times daily. Patient not taking: Reported on 11/27/2022 08/06/22   Shade Flood, MD  benzonatate (TESSALON) 100 MG capsule Take 1 capsule (100 mg total) by mouth 3 (three) times daily as needed for cough. Patient not taking: Reported on 11/27/2022 08/06/22   Shade Flood, MD  Cyanocobalamin (VITAMIN B-12 PO) Take 100 mcg by mouth. Patient not taking: Reported on 11/27/2022    [provider]  Diclofenac Sodium 1 % CREA Apply 4 g topically 4 (four) times daily as needed. To knee Patient not taking: Reported on 11/27/2022 11/22/21   Shade Flood, MD  fluticasone [facility]) 50 MCG/ACT nasal spray Place 2 sprays into both nostrils daily. Patient not taking: Reported on 11/27/2022 08/06/22   Shade Flood, MD  ipratropium (ATROVENT) 0.06 % nasal spray Place 1-2 sprays into both nostrils 4 (four) times daily. As needed for nasal congestion Patient not taking: Reported on 11/27/2022 08/06/22   Shade Flood, MD   Social History   Socioeconomic History   Marital status: Married    Spouse name: Not on file   Number of children: Not on file   Years of education: Not on file    Highest education level: 12th grade  Occupational History   Not on file  Tobacco Use   Smoking status: Never   Smokeless tobacco: Never  Vaping Use   Vaping status: Never Used  Substance and Sexual Activity   Alcohol use: Yes    Alcohol/week: 3.0 standard drinks of alcohol    Types: 3 Glasses of wine per week    Comment: Max per month   Drug use: No   Sexual activity: Yes    Birth control/protection: Post-menopausal  Other Topics Concern  Not on file  Social History Narrative   Not on file   Social Determinants of Health   Financial Resource Strain: Low Risk  (08/06/2022)   Overall Financial Resource Strain (CARDIA)    Difficulty of Paying Living Expenses: Not hard at all  Food Insecurity: No Food Insecurity (08/06/2022)   Hunger Vital Sign    Worried About Running Out of Food in the Last Year: Never true    Ran Out of Food in the Last Year: Never true  Transportation Needs: No Transportation Needs (08/06/2022)   PRAPARE - Administrator, Civil Service (Medical): No    Lack of Transportation (Non-Medical): No  Physical Activity: Sufficiently Active (08/06/2022)   Exercise Vital Sign    Days of Exercise per Week: 4 days    Minutes of Exercise per Session: 60 min  Stress: No Stress Concern Present (08/06/2022)   [employer] of Occupational Health - Occupational Stress Questionnaire    Feeling of Stress : Not at all  Social Connections: Moderately Integrated (08/06/2022)   Social Connection and Isolation Panel [NHANES]    Frequency of Communication with Friends and Family: Once a week    Frequency of Social Gatherings with Friends and Family: Once a week    Attends Religious Services: More than 4 times per year    Active Member of [employer] or Organizations: Yes    Attends Engineer, structural: More than 4 times per year    Marital Status: Married  Catering manager Violence: Not on file    Review of Systems  13 point review of systems per patient  health survey noted.  Negative other than as indicated above or in HPI.   Objective:   Vitals:   11/27/22 1102  BP: 126/60  Pulse: 96  Temp: 97.8 F (36.6 C)  TempSrc: Temporal  SpO2: 99%  Weight: 136 lb 6.4 oz (61.9 kg)  Height: 5\' 5"  (1.651 m)     Physical Exam Constitutional:      Appearance: She is well-developed.  HENT:     Head: Normocephalic and atraumatic.     Right Ear: External ear normal.     Left Ear: External ear normal.  Eyes:     Conjunctiva/sclera: Conjunctivae normal.     Pupils: Pupils are equal, round, and reactive to light.  Neck:     Thyroid: No thyromegaly.  Cardiovascular:     Rate and Rhythm: Normal rate and regular rhythm.     Heart sounds: Normal heart sounds. No murmur heard. Pulmonary:     Effort: Pulmonary effort is normal. No respiratory distress.     Breath sounds: Normal breath sounds. No wheezing.  Abdominal:     General: Bowel sounds are normal.     Palpations: Abdomen is soft.     Tenderness: There is no abdominal tenderness.  Musculoskeletal:        General: No tenderness. Normal range of motion.     Cervical back: Normal range of motion and neck supple.  Lymphadenopathy:     Cervical: No cervical adenopathy.  Skin:    General: Skin is warm and dry.     Findings: No rash.     Comments: Left great toe, approximately 2 to 3 mm soft nodular area just proximal to the nail, no erythema, nontender.  Neurological:     Mental Status: She is alert and oriented to person, place, and time.  Psychiatric:        Behavior: Behavior normal.  Thought Content: Thought content normal.        Assessment & Plan:  Eastyn Skalla is a 62 y.o. female . Annual physical exam - Plan: CBC with Differential/Platelet, Comprehensive metabolic panel, Lipid panel, TSH, Hemoglobin A1c  - -anticipatory guidance as below in AVS, screening labs above. Health maintenance items as above in HPI discussed/recommended as applicable.   Hyperlipidemia,  unspecified hyperlipidemia type - Plan: Comprehensive metabolic panel, Lipid panel  -Tolerating current regimen, continue same dose Lipitor.  Check labs with adjustment of plan accordingly  Hyperglycemia - Plan: Comprehensive metabolic panel, Hemoglobin A1c  -Check A1c, no new meds for now.  Continue healthy diet and exercise as above.  Nodule of skin of toe - Plan: Ambulatory referral to Podiatry  -Possible skin nodule, less likely bony prominence, refer to podiatry.  No orders of the defined types were placed in this encounter.  Patient Instructions  I will refer you to podiatry to evaluate nodule/bump.  If any concerns on labs I will let you know but no medication changes for now.  Schedule appointment with eye provider when possible.  Let me know if there are any questions and take care.  Preventive Care 64-36 Years Old, Female Preventive care refers to lifestyle choices and visits with your health care provider that can promote health and wellness. Preventive care visits are also called wellness exams. What can I expect for my preventive care visit? Counseling Your health care provider may ask you questions about your: Medical history, including: Past medical problems. Family medical history. Pregnancy history. Current health, including: Menstrual cycle. Method of birth control. Emotional well-being. Home life and relationship well-being. Sexual activity and sexual health. Lifestyle, including: Alcohol, nicotine or tobacco, and drug use. Access to firearms. Diet, exercise, and sleep habits. Work and work Astronomer. Sunscreen use. Safety issues such as seatbelt and bike helmet use. Physical exam Your health care provider will check your: Height and weight. These may be used to calculate your BMI (body mass index). BMI is a measurement that tells if you are at a healthy weight. Waist circumference. This measures the distance around your waistline. This measurement also  tells if you are at a healthy weight and may help predict your risk of certain diseases, such as type 2 diabetes and high blood pressure. Heart rate and blood pressure. Body temperature. Skin for abnormal spots. What immunizations do I need?  Vaccines are usually given at various ages, according to a schedule. Your health care provider will recommend vaccines for you based on your age, medical history, and lifestyle or other factors, such as travel or where you work. What tests do I need? Screening Your health care provider may recommend screening tests for certain conditions. This may include: Lipid and cholesterol levels. Diabetes screening. This is done by checking your blood sugar (glucose) after you have not eaten for a while (fasting). Pelvic exam and Pap test. Hepatitis B test. Hepatitis C test. HIV (human immunodeficiency virus) test. STI (sexually transmitted infection) testing, if you are at risk. Lung cancer screening. Colorectal cancer screening. Mammogram. Talk with your health care provider about when you should start having regular mammograms. This may depend on whether you have a family history of breast cancer. BRCA-related cancer screening. This may be done if you have a family history of breast, ovarian, tubal, or peritoneal cancers. Bone density scan. This is done to screen for osteoporosis. Talk with your health care provider about your test results, treatment options, and if necessary, the need  for more tests. Follow these instructions at home: Eating and drinking  Eat a diet that includes fresh fruits and vegetables, whole grains, lean protein, and low-fat dairy products. Take vitamin and mineral supplements as recommended by your health care provider. Do not drink alcohol if: Your health care provider tells you not to drink. You are pregnant, may be pregnant, or are planning to become pregnant. If you drink alcohol: Limit how much you have to 0-1 drink a  day. Know how much alcohol is in your drink. In the [place] one drink equals one 12 oz bottle of beer (355 mL), one 5 oz glass of wine (148 mL), or one 1 oz glass of hard liquor (44 mL). Lifestyle Brush your teeth every morning and night with fluoride toothpaste. Floss one time each day. Exercise for at least 30 minutes 5 or more days each week. Do not use any products that contain nicotine or tobacco. These products include cigarettes, chewing tobacco, and vaping devices, such as e-cigarettes. If you need help quitting, ask your health care provider. Do not use drugs. If you are sexually active, practice safe sex. Use a condom or other form of protection to prevent STIs. If you do not wish to become pregnant, use a form of birth control. If you plan to become pregnant, see your health care provider for a prepregnancy visit. Take aspirin only as told by your health care provider. Make sure that you understand how much to take and what form to take. Work with your health care provider to find out whether it is safe and beneficial for you to take aspirin daily. Find healthy ways to manage stress, such as: Meditation, yoga, or listening to music. Journaling. Talking to a trusted person. Spending time with friends and family. Minimize exposure to UV radiation to reduce your risk of skin cancer. Safety Always wear your seat belt while driving or riding in a vehicle. Do not drive: If you have been drinking alcohol. Do not ride with someone who has been drinking. When you are tired or distracted. While texting. If you have been using any mind-altering substances or drugs. Wear a helmet and other protective equipment during sports activities. If you have firearms in your house, make sure you follow all gun safety procedures. Seek help if you have been physically or sexually abused. What's next? Visit your health care provider once a year for an annual wellness visit. Ask your health care provider  how often you should have your eyes and teeth checked. Stay up to date on all vaccines. This information is not intended to replace advice given to you by your health care provider. Make sure you discuss any questions you have with your health care provider. Document Revised: 10/24/2020 Document Reviewed: 10/24/2020 Elsevier Patient Education  2024 Elsevier Inc.     Signed,   Meredith Staggers, MD [facility], [facility] 11/27/22 11:35 AM

## 2022-11-28 ENCOUNTER — Other Ambulatory Visit: Payer: Self-pay | Admitting: Family Medicine

## 2022-12-10 ENCOUNTER — Ambulatory Visit (INDEPENDENT_AMBULATORY_CARE_PROVIDER_SITE_OTHER): Payer: BC Managed Care – PPO

## 2022-12-10 ENCOUNTER — Encounter: Payer: Self-pay | Admitting: Podiatry

## 2022-12-10 ENCOUNTER — Ambulatory Visit (INDEPENDENT_AMBULATORY_CARE_PROVIDER_SITE_OTHER): Payer: BC Managed Care – PPO | Admitting: Podiatry

## 2022-12-10 VITALS — BP 150/86

## 2022-12-10 DIAGNOSIS — M79675 Pain in left toe(s): Secondary | ICD-10-CM

## 2022-12-10 DIAGNOSIS — M7989 Other specified soft tissue disorders: Secondary | ICD-10-CM

## 2022-12-10 NOTE — Progress Notes (Signed)
  Subjective:  Patient ID: [name], female    DOB: [birth year],   MRN: [id number]  Chief Complaint  Patient presents with   Toe Pain    new pt-Nodular area distal left great toe x 1 year-non req-Dr. Meredith Staggers refer,sore to touch    62 y.o. female presents for concern of a nodule on her left great toe that she has had for about a year. Relates it has not changes much in the last year. Relates no usually painful except in certain shoes when it will get irritated. Denies any treatments. Does have a history of possible cysts in other areas.  . Denies any other pedal complaints. Denies n/v/f/c.   Past Medical History:  Diagnosis Date   Mitral valve prolapse    Seen by [facility]. this year, no test/no F/U required   Seasonal allergies     Objective:  Physical Exam: Vascular: DP/PT pulses 2/4 bilateral. CFT <3 seconds. Normal hair growth on digits. No edema.  Skin. No lacerations or abrasions bilateral feet.  Musculoskeletal: MMT 5/5 bilateral lower extremities in DF, PF, Inversion and Eversion. Deceased ROM in DF of ankle joint. Left hallux dorsal medial IPJ with 0.5 cm soft tissue mass noted with mild fluctuance. Non-mobile and minimally tender to palpation.  Neurological: Sensation intact to light touch.   Assessment:   1. Soft tissue mass      Plan:  Patient was evaluated and treated and all questions answered. X-rays reviewed and discussed with patient. No osseous changes noted around the left hallux  Discussed ganglion cysts and treatment options with the patient. Patient elected to defer aspiration today and will continue to monitor.  Discussed if any changes or pain start to return and we can go ahead with aspiration procedure.  Return to clinic as needed.     Louann Sjogren, DPM

## 2023-01-23 ENCOUNTER — Ambulatory Visit (INDEPENDENT_AMBULATORY_CARE_PROVIDER_SITE_OTHER): Payer: BC Managed Care – PPO | Admitting: Family Medicine

## 2023-01-23 ENCOUNTER — Encounter: Payer: Self-pay | Admitting: Family Medicine

## 2023-01-23 VITALS — BP 124/80 | HR 77 | Temp 97.8°F | Ht 65.0 in | Wt 136.4 lb

## 2023-01-23 DIAGNOSIS — J029 Acute pharyngitis, unspecified: Secondary | ICD-10-CM

## 2023-01-23 DIAGNOSIS — U071 COVID-19: Secondary | ICD-10-CM | POA: Diagnosis not present

## 2023-01-23 LAB — POCT RAPID STREP A (OFFICE): Rapid Strep A Screen: NEGATIVE

## 2023-01-23 LAB — POC COVID19 BINAXNOW: SARS Coronavirus 2 Ag: POSITIVE — AB

## 2023-01-23 NOTE — Patient Instructions (Addendum)
-  Rapid covid is positive and rapid strep throat is negative.  -Through shared decision making, decided to not take Paxlovid. Will treat with supportive measures.  -Alternate Tylenol 1000mg  and Ibuprofen 600-800mg  every 4 hours for pain, headache, and body aches. Recommend to eat something when taking Ibuprofen.   -Recommend to add Zinc 50-100mg  daily for 30 days with already taking vitamin D and vitamin C to support immune symptom.  -Recommend to take OTC Mucinex for cough and congestion. Also, may continue to use cough drops, gargle with warm salt water, and Chloraseptic spray to relieve throat discomfort.  -Rest, hydrate. -Advise if you have chest pain or SHOB, please go to the closes emergency department.  -Follow up if not improved.

## 2023-01-23 NOTE — Progress Notes (Signed)
Acute Office Visit   Subjective:  Patient ID: [name], female    DOB: January 01, 1961, 62 y.o.   MRN: [id number]  Chief Complaint  Patient presents with   Sore Throat    Pt is here today with C/O sore throat. 5 days low grade fever,body aches. Pt reports no testing. Pt reports she was in airport Saturday. OTC: Advil    Sore Throat    Patient is a 62 year old female that presents with: Sore throat Low grade fever Body aches Frontal headache Ear pain with no discharge  Nasal congestion and drainage  Dry cough Fatigue-second day  Nausea mild, no vomiting.   Denies chest pain or [facility],    She reports her symptoms started Monday. Recently traveled out of [place].    She is taking Advil and cough drops. With some relief.  ROS See HPI above      Objective:   BP 124/80   Pulse 77   Temp 97.8 F (36.6 C)   Ht 5\' 5"  (1.651 m)   Wt 136 lb 6 oz (61.9 kg)   SpO2 (!) 77%   BMI 22.69 kg/m    Physical Exam Vitals reviewed.  Constitutional:      General: She is not in acute distress.    Appearance: Normal appearance. She is ill-appearing (Mild). She is not toxic-appearing or diaphoretic.  Eyes:     General:        Right eye: No discharge.        Left eye: No discharge.     Conjunctiva/sclera: Conjunctivae normal.  Cardiovascular:     Rate and Rhythm: Normal rate and regular rhythm.     Heart sounds: Normal heart sounds. No murmur heard.    No friction rub. No gallop.  Pulmonary:     Effort: Pulmonary effort is normal. No respiratory distress.     Breath sounds: Normal breath sounds.     Comments: No coughing during visit Musculoskeletal:        General: Normal range of motion.  Skin:    General: Skin is warm and dry.  Neurological:     General: No focal deficit present.     Mental Status: She is alert and oriented to person, place, and time. Mental status is at baseline.  Psychiatric:        Mood and Affect: Mood normal.        Behavior: Behavior normal.         Thought Content: Thought content normal.        Judgment: Judgment normal.    Results for orders placed or performed in visit on 01/23/23  POC COVID-19  Result Value Ref Range   SARS Coronavirus 2 Ag Positive (A) Negative  POCT rapid strep A  Result Value Ref Range   Rapid Strep A Screen Negative Negative      Assessment & Plan:  Sore throat -     POC COVID-19 BinaxNow -     POCT rapid strep A  COVID  -Rapid covid is positive and rapid strep throat is negative.  -Through shared decision making, decided to not take Paxlovid. Will treat with supportive measures.  -Alternate Tylenol 1000mg  and Ibuprofen 600-800mg  every 4 hours for pain, headache, and body aches. Recommend to eat something when taking Ibuprofen.   -Recommend to add Zinc 50-100mg  daily for 30 days with already taking vitamin D and vitamin C to support immune symptom.  -Recommend to take OTC Mucinex for cough and congestion.  Also, may continue to use cough drops, gargle with warm salt water, and Chloraseptic spray to relieve throat discomfort.  -Rest, hydrate. -Advise if she has chest pain or SHOB, please go to the closes emergency department.  -Follow up if not improved.    Zandra Abts, NP

## 2023-04-15 DIAGNOSIS — Z1231 Encounter for screening mammogram for malignant neoplasm of breast: Secondary | ICD-10-CM | POA: Diagnosis not present

## 2023-04-15 DIAGNOSIS — Z124 Encounter for screening for malignant neoplasm of cervix: Secondary | ICD-10-CM | POA: Diagnosis not present

## 2023-04-15 DIAGNOSIS — Z1331 Encounter for screening for depression: Secondary | ICD-10-CM | POA: Diagnosis not present

## 2023-04-15 DIAGNOSIS — Z01419 Encounter for gynecological examination (general) (routine) without abnormal findings: Secondary | ICD-10-CM | POA: Diagnosis not present

## 2023-04-15 LAB — HM MAMMOGRAPHY

## 2023-07-09 DIAGNOSIS — N951 Menopausal and female climacteric states: Secondary | ICD-10-CM | POA: Diagnosis not present

## 2023-11-27 ENCOUNTER — Encounter: Payer: Self-pay | Admitting: Advanced Practice Midwife

## 2023-11-30 ENCOUNTER — Encounter: Payer: BC Managed Care – PPO | Admitting: Family Medicine

## 2024-05-13 ENCOUNTER — Ambulatory Visit (INDEPENDENT_AMBULATORY_CARE_PROVIDER_SITE_OTHER): Payer: Self-pay | Admitting: Family Medicine

## 2024-05-13 ENCOUNTER — Encounter: Payer: Self-pay | Admitting: Family Medicine

## 2024-05-13 VITALS — BP 134/88 | HR 73 | Temp 98.0°F | Resp 16 | Ht 65.0 in | Wt 144.0 lb

## 2024-05-13 DIAGNOSIS — R350 Frequency of micturition: Secondary | ICD-10-CM | POA: Diagnosis not present

## 2024-05-13 DIAGNOSIS — N390 Urinary tract infection, site not specified: Secondary | ICD-10-CM

## 2024-05-13 DIAGNOSIS — R319 Hematuria, unspecified: Secondary | ICD-10-CM | POA: Diagnosis not present

## 2024-05-13 LAB — POCT URINALYSIS DIPSTICK
Bilirubin, UA: NEGATIVE
Glucose, UA: NEGATIVE
Ketones, UA: 5 — AB
Leukocytes, UA: NEGATIVE
Nitrite, UA: NEGATIVE
Odor: NORMAL
Protein, UA: POSITIVE — AB
Spec Grav, UA: <=1.005 — AB
Urobilinogen, UA: 0.2 U/dL
pH, UA: 6

## 2024-05-13 MED ORDER — NITROFURANTOIN MONOHYD MACRO 100 MG PO CAPS: 100.0000 mg | ORAL_CAPSULE | Freq: Two times a day (BID) | ORAL | 0 refills | Status: AC

## 2024-05-13 NOTE — Patient Instructions (Signed)
 Thank you for coming in today.  I sent in the Macrobid to your pharmacy, 1 pill twice per day for urinary tract infection.  I suspect that your symptoms are improving based on the urinalysis today but with the positive at-home testing I do think this was a urinary tract infection.  I also think the blood in the urine is likely related to infection, but lets schedule a lab visit in a few weeks for repeat urinalysis just to make sure that clears up.  You should continue to improve, but please be seen if any new or worsening symptoms.  Take care!  Urinary Tract Infection, Female A urinary tract infection (UTI) is an infection in your urinary tract. The urinary tract is made up of organs that make, store, and get rid of pee (urine) in your body. These organs include: The kidneys. The ureters. The bladder. The urethra. What are the causes? Most UTIs are caused by germs called bacteria. They may be in or near your genitals. These germs grow and cause swelling in your urinary tract. What increases the risk? You're more likely to get a UTI if: You're a female. The urethra is shorter in females than in males. You have a soft tube called a catheter that drains your pee. You can't control when you pee or poop. You have trouble peeing because of: A kidney stone. A urinary blockage. A nerve condition that affects your bladder. Not getting enough to drink. You're sexually active. You use a birth control inside your vagina, like spermicide. You're pregnant. You have low levels of the hormone estrogen in your body. You're an older adult. You're also more likely to get a UTI if you have other health problems. These may include: Diabetes. A weak immune system. Your immune system is your body's defense system. Sickle cell disease. Injury of the spine. What are the signs or symptoms? Symptoms may include: Needing to pee right away. Peeing small amounts often. Pain or burning when you pee. Blood in  your pee. Pee that smells bad or odd. Pain in your belly or lower back. You may also: Feel confused. This may be the first symptom in older adults. Vomit. Not feel hungry. Feel tired or easily annoyed. Have a fever or chills. How is this diagnosed? A UTI is diagnosed based on your medical history and an exam. You may also have other tests. These may include: Pee tests. Blood tests. Tests for sexually transmitted infections (STIs). If you've had more than one UTI, you may need to have imaging studies done to find out why you keep getting them. How is this treated? A UTI can be treated by: Taking antibiotics or other medicines. Drinking enough fluid to keep your pee pale yellow. In rare cases, a UTI can cause a very bad condition called sepsis. Sepsis may be treated in the hospital. Follow these instructions at home: Medicines Take your medicines only as told by your health care provider. If you were given antibiotics, take them as told by your provider. Do not stop taking them even if you start to feel better. General instructions Make sure you: Pee often and fully. Do not hold your pee for a long time. Wipe from front to back after you pee or poop. Use each tissue only once when you wipe. Pee after you have sex. Do not douche or use sprays or powders in your genital area. Contact a health care provider if: Your symptoms don't get better after 1-2 days of taking  antibiotics. Your symptoms go away and then come back. You have a fever or chills. You vomit or feel like you may vomit. Get help right away if: You have very bad pain in your back or lower belly. You faint. This information is not intended to replace advice given to you by your health care provider. Make sure you discuss any questions you have with your health care provider. Document Revised: 04/08/2023 Document Reviewed: 08/01/2022 Elsevier Patient Education  2025 [employer].

## 2024-05-13 NOTE — Progress Notes (Signed)
 "  Subjective:  Patient ID: [name], female    DOB: 03-04-1961  Age: 64 y.o. MRN: [id number]  CC:  Chief Complaint  Patient presents with   Acute Visit    Possible UTI. Sx started 31st. Took an in-home test on 31st. Called and spoke to nurse triage. Pressure. Using the restroom often. Has increased fluids to flush system. Took AZO 24 hours ago. Noticed blood clots in urine.     HPI [name] presents for   Urinary frequency, dysuria/pressure Initial symptoms started 2 days ago - noted in afternoon - pressure and pain to urinate. No initial hematuria - noted yesterday. Small pinpoint drops of blood. Positive azo home UTI test 2 days ago. Took azo for symptoms 2 nights ago and yesterday.  No fever/n/v/back pain.  No hx of nephrolithiasis. Drinking fluids. On daily cranberry pill for UTI prevention.  Last UTI about 6 years ago. Feeling somewhat better today.  Small blood on urinalysis today.     History There are no active problems to display for this patient.  Past Medical History:  Diagnosis Date   Mitral valve prolapse    Seen by [facility]. this year, no test/no F/U required   Seasonal allergies    Past Surgical History:  Procedure Laterality Date   SVD     x 3   WISDOM TOOTH EXTRACTION     Allergies[1] Prior to Admission medications  Medication Sig Start Date End Date Taking? Authorizing Provider  Calcium  Carb-Cholecalciferol (CALCIUM -VITAMIN D) 500-200 MG-UNIT tablet Take 1 tablet by mouth daily.   Yes [provider]  Cholecalciferol (VITAMIN D3) 125 MCG (5000 UT) CAPS Take by mouth.   Yes [provider]  CRANBERRY PO Take by mouth.   Yes [provider]  estradiol  (VIVELLE -DOT) 0.05 MG/24HR Place 1 patch onto the skin once a week. Sunday/Wednesday     Yes [provider]  Probiotic Product (PROBIOTIC PO) Take by mouth.   Yes [provider]  progesterone (PROMETRIUM) 100 MG capsule Take 100 mg by mouth  daily.     Yes [provider]  atorvastatin  (LIPITOR) 10 MG tablet TAKE 1 TABLET BY MOUTH DAILY Patient not taking: Reported on 05/13/2024 11/28/22   Levora Reyes SAUNDERS, MD   Social History   Socioeconomic History   Marital status: Married    Spouse name: Not on file   Number of children: Not on file   Years of education: Not on file   Highest education level: 12th grade  Occupational History   Not on file  Tobacco Use   Smoking status: Never   Smokeless tobacco: Never  Vaping Use   Vaping status: Never Used  Substance and Sexual Activity   Alcohol use: Yes    Alcohol/week: 3.0 standard drinks of alcohol    Types: 3 Glasses of wine per week    Comment: Max per month   Drug use: No   Sexual activity: Yes    Birth control/protection: Post-menopausal  Other Topics Concern   Not on file  Social History Narrative   Not on file   Social Drivers of Health   Tobacco Use: Low Risk (05/13/2024)   Patient History    Smoking Tobacco Use: Never    Smokeless Tobacco Use: Never    Passive Exposure: Not on file  Financial Resource Strain: Low Risk (08/06/2022)   Overall Financial Resource Strain (CARDIA)    Difficulty of Paying Living Expenses: Not hard at all  Food Insecurity: No  Food Insecurity (08/06/2022)   Hunger Vital Sign    Worried About Running Out of Food in the Last Year: Never true    Ran Out of Food in the Last Year: Never true  Transportation Needs: No Transportation Needs (08/06/2022)   PRAPARE - Administrator, Civil Service (Medical): No    Lack of Transportation (Non-Medical): No  Physical Activity: Sufficiently Active (08/06/2022)   Exercise Vital Sign    Days of Exercise per Week: 4 days    Minutes of Exercise per Session: 60 min  Stress: No Stress Concern Present (08/06/2022)   [employer] of Occupational Health - Occupational Stress Questionnaire    Feeling of Stress : Not at all  Social Connections: Moderately Integrated (08/06/2022)    Social Connection and Isolation Panel    Frequency of Communication with Friends and Family: Once a week    Frequency of Social Gatherings with Friends and Family: Once a week    Attends Religious Services: More than 4 times per year    Active Member of Clubs or Organizations: Yes    Attends Banker Meetings: More than 4 times per year    Marital Status: Married  Catering Manager Violence: Not on file  Depression (PHQ2-9): Low Risk (01/23/2023)   Depression (PHQ2-9)    PHQ-2 Score: 0  Alcohol Screen: Low Risk (08/06/2022)   Alcohol Screen    Last Alcohol Screening Score (AUDIT): 1  Housing: Low Risk (08/06/2022)   Housing    Last Housing Risk Score: 0  Utilities: Not on file  Health Literacy: Not on file    Review of Systems Per HPI.   Objective:   Vitals:   05/13/24 1202  BP: 134/88  Pulse: 73  Resp: 16  Temp: 98 F (36.7 C)  TempSrc: Temporal  SpO2: 98%  Weight: 144 lb (65.3 kg)  Height: 5' 5 (1.651 m)     Physical Exam Constitutional:      Appearance: Normal appearance. She is well-developed.  HENT:     Head: Normocephalic and atraumatic.  Pulmonary:     Effort: Pulmonary effort is normal.  Abdominal:     General: There is no distension.     Palpations: Abdomen is soft.     Tenderness: There is no abdominal tenderness. There is no guarding or rebound.  Skin:    General: Skin is warm.  Neurological:     Mental Status: She is alert and oriented to person, place, and time.  Psychiatric:        Behavior: Behavior normal.     Results for orders placed or performed in visit on 05/13/24  POCT urinalysis dipstick   Collection Time: 05/13/24 12:23 PM  Result Value Ref Range   Color, UA Yellow    Clarity, UA Clear    Glucose, UA Negative Negative   Bilirubin, UA Negative    Ketones, UA 5 (A)    Spec Grav, UA <=1.005 (A) 1.010 - 1.025   Blood, UA +++ (A)    pH, UA 6.0 5.0 - 8.0   Protein, UA Positive (A) Negative   Urobilinogen, UA 0.2 0.2  or 1.0 E.U./dL   Nitrite, UA Negative    Leukocytes, UA Negative Negative   Appearance small pin size bloot clot    Odor normal       Assessment & Plan:  [name] is a 64 y.o. female . Urinary frequency - Plan: POCT urinalysis dipstick, nitrofurantoin, macrocrystal-monohydrate, (MACROBID) 100 MG capsule, Urine Culture  Urinary tract infection with hematuria, site unspecified - Plan: nitrofurantoin, macrocrystal-monohydrate, (MACROBID) 100 MG capsule, Urine Culture Suspected hemorrhagic cystitis with positive nitrite, LE on home test kit 2 days ago.  Improved in office but persistent blood.  Will treat with Macrobid with potential side effects discussed, check urine culture.  If side effects, then can stop antibiotics as already improving.  Lab only visit in next few weeks to document clearing of hematuria, further workup if persistent.  Symptomatic care, fluids, handout given, RTC precautions.  Meds ordered this encounter  Medications   nitrofurantoin, macrocrystal-monohydrate, (MACROBID) 100 MG capsule    Sig: Take 1 capsule (100 mg total) by mouth 2 (two) times daily for 5 days.    Dispense:  10 capsule    Refill:  0   Patient Instructions  Thank you for coming in today.  I sent in the Macrobid to your pharmacy, 1 pill twice per day for urinary tract infection.  I suspect that your symptoms are improving based on the urinalysis today but with the positive at-home testing I do think this was a urinary tract infection.  I also think the blood in the urine is likely related to infection, but lets schedule a lab visit in a few weeks for repeat urinalysis just to make sure that clears up.  You should continue to improve, but please be seen if any new or worsening symptoms.  Take care!  Urinary Tract Infection, Female A urinary tract infection (UTI) is an infection in your urinary tract. The urinary tract is made up of organs that make, store, and get rid of pee (urine) in your body.  These organs include: The kidneys. The ureters. The bladder. The urethra. What are the causes? Most UTIs are caused by germs called bacteria. They may be in or near your genitals. These germs grow and cause swelling in your urinary tract. What increases the risk? You're more likely to get a UTI if: You're a female. The urethra is shorter in females than in males. You have a soft tube called a catheter that drains your pee. You can't control when you pee or poop. You have trouble peeing because of: A kidney stone. A urinary blockage. A nerve condition that affects your bladder. Not getting enough to drink. You're sexually active. You use a birth control inside your vagina, like spermicide. You're pregnant. You have low levels of the hormone estrogen in your body. You're an older adult. You're also more likely to get a UTI if you have other health problems. These may include: Diabetes. A weak immune system. Your immune system is your body's defense system. Sickle cell disease. Injury of the spine. What are the signs or symptoms? Symptoms may include: Needing to pee right away. Peeing small amounts often. Pain or burning when you pee. Blood in your pee. Pee that smells bad or odd. Pain in your belly or lower back. You may also: Feel confused. This may be the first symptom in older adults. Vomit. Not feel hungry. Feel tired or easily annoyed. Have a fever or chills. How is this diagnosed? A UTI is diagnosed based on your medical history and an exam. You may also have other tests. These may include: Pee tests. Blood tests. Tests for sexually transmitted infections (STIs). If you've had more than one UTI, you may need to have imaging studies done to find out why you keep getting them. How is this treated? A UTI can be treated by: Taking antibiotics or other medicines.  Drinking enough fluid to keep your pee pale yellow. In rare cases, a UTI can cause a very bad condition  called sepsis. Sepsis may be treated in the hospital. Follow these instructions at home: Medicines Take your medicines only as told by your health care provider. If you were given antibiotics, take them as told by your provider. Do not stop taking them even if you start to feel better. General instructions Make sure you: Pee often and fully. Do not hold your pee for a long time. Wipe from front to back after you pee or poop. Use each tissue only once when you wipe. Pee after you have sex. Do not douche or use sprays or powders in your genital area. Contact a health care provider if: Your symptoms don't get better after 1-2 days of taking antibiotics. Your symptoms go away and then come back. You have a fever or chills. You vomit or feel like you may vomit. Get help right away if: You have very bad pain in your back or lower belly. You faint. This information is not intended to replace advice given to you by your health care provider. Make sure you discuss any questions you have with your health care provider. Document Revised: 04/08/2023 Document Reviewed: 08/01/2022 Elsevier Patient Education  2025 [employer].     Signed,   Reyes Pines, MD [facility], [facility] 05/13/2024 12:39 PM      [1] No Known Allergies  "

## 2024-05-14 LAB — URINE CULTURE
MICRO NUMBER:: 17419688
Result:: NO GROWTH
SPECIMEN QUALITY:: ADEQUATE

## 2024-05-16 ENCOUNTER — Ambulatory Visit: Payer: Self-pay | Admitting: Family Medicine

## 2024-06-10 ENCOUNTER — Other Ambulatory Visit
